# Patient Record
Sex: Female | Born: 1960 | Race: White | Hispanic: Yes | Smoking: Never smoker
Health system: Southern US, Community
[De-identification: ages and names within clinical notes are randomized; demographics above are authoritative.]

## PROBLEM LIST (undated history)

## (undated) DIAGNOSIS — IMO0002 Reserved for concepts with insufficient information to code with codable children: Secondary | ICD-10-CM

## (undated) DIAGNOSIS — I7300 Raynaud's syndrome without gangrene: Secondary | ICD-10-CM

## (undated) DIAGNOSIS — M3500 Sicca syndrome, unspecified: Secondary | ICD-10-CM

## (undated) DIAGNOSIS — M329 Systemic lupus erythematosus, unspecified: Secondary | ICD-10-CM

## (undated) HISTORY — PX: TUBAL LIGATION: SHX77

## (undated) HISTORY — PX: BREAST SURGERY: SHX581

---

## 1994-07-10 HISTORY — PX: BREAST CYST ASPIRATION: SHX578

## 1997-07-10 HISTORY — PX: BREAST EXCISIONAL BIOPSY: SUR124

## 2000-09-17 ENCOUNTER — Emergency Department (HOSPITAL_COMMUNITY): Admission: EM | Admit: 2000-09-17 | Discharge: 2000-09-17 | Payer: Self-pay | Admitting: Emergency Medicine

## 2001-04-09 ENCOUNTER — Other Ambulatory Visit: Admission: RE | Admit: 2001-04-09 | Discharge: 2001-04-09 | Payer: Self-pay | Admitting: Gynecology

## 2001-04-26 ENCOUNTER — Encounter: Payer: Self-pay | Admitting: Gynecology

## 2001-04-26 ENCOUNTER — Ambulatory Visit (HOSPITAL_COMMUNITY): Admission: RE | Admit: 2001-04-26 | Discharge: 2001-04-26 | Payer: Self-pay | Admitting: Gynecology

## 2001-12-12 ENCOUNTER — Ambulatory Visit (HOSPITAL_COMMUNITY): Admission: RE | Admit: 2001-12-12 | Discharge: 2001-12-12 | Payer: Self-pay | Admitting: Internal Medicine

## 2001-12-12 ENCOUNTER — Encounter: Payer: Self-pay | Admitting: Internal Medicine

## 2001-12-16 ENCOUNTER — Encounter (INDEPENDENT_AMBULATORY_CARE_PROVIDER_SITE_OTHER): Payer: Self-pay | Admitting: Family Medicine

## 2001-12-16 LAB — CONVERTED CEMR LAB: Pap Smear: NORMAL

## 2002-02-19 ENCOUNTER — Encounter: Admission: RE | Admit: 2002-02-19 | Discharge: 2002-02-19 | Payer: Self-pay | Admitting: Family Medicine

## 2002-02-19 ENCOUNTER — Encounter: Payer: Self-pay | Admitting: Family Medicine

## 2003-05-22 ENCOUNTER — Encounter: Admission: RE | Admit: 2003-05-22 | Discharge: 2003-05-22 | Payer: Self-pay | Admitting: Family Medicine

## 2003-06-05 ENCOUNTER — Encounter: Admission: RE | Admit: 2003-06-05 | Discharge: 2003-06-05 | Payer: Self-pay | Admitting: Family Medicine

## 2003-08-06 ENCOUNTER — Encounter: Admission: RE | Admit: 2003-08-06 | Discharge: 2003-08-06 | Payer: Self-pay | Admitting: Family Medicine

## 2004-01-08 ENCOUNTER — Emergency Department (HOSPITAL_COMMUNITY): Admission: EM | Admit: 2004-01-08 | Discharge: 2004-01-08 | Payer: Self-pay | Admitting: Emergency Medicine

## 2004-03-04 ENCOUNTER — Inpatient Hospital Stay (HOSPITAL_COMMUNITY): Admission: AD | Admit: 2004-03-04 | Discharge: 2004-03-04 | Payer: Self-pay | Admitting: Gynecology

## 2004-05-27 ENCOUNTER — Ambulatory Visit (HOSPITAL_COMMUNITY): Admission: RE | Admit: 2004-05-27 | Discharge: 2004-05-27 | Payer: Self-pay | Admitting: Family Medicine

## 2004-07-19 ENCOUNTER — Ambulatory Visit (HOSPITAL_COMMUNITY): Admission: RE | Admit: 2004-07-19 | Discharge: 2004-07-19 | Payer: Self-pay | Admitting: Internal Medicine

## 2005-12-13 ENCOUNTER — Ambulatory Visit (HOSPITAL_COMMUNITY): Admission: RE | Admit: 2005-12-13 | Discharge: 2005-12-13 | Payer: Self-pay | Admitting: Internal Medicine

## 2005-12-15 ENCOUNTER — Ambulatory Visit: Payer: Self-pay | Admitting: Family Medicine

## 2005-12-15 ENCOUNTER — Ambulatory Visit: Payer: Self-pay | Admitting: *Deleted

## 2006-03-22 ENCOUNTER — Encounter: Admission: RE | Admit: 2006-03-22 | Discharge: 2006-03-22 | Payer: Self-pay | Admitting: *Deleted

## 2006-04-09 ENCOUNTER — Ambulatory Visit (HOSPITAL_COMMUNITY): Admission: RE | Admit: 2006-04-09 | Discharge: 2006-04-09 | Payer: Self-pay | Admitting: Internal Medicine

## 2006-04-12 ENCOUNTER — Other Ambulatory Visit: Admission: RE | Admit: 2006-04-12 | Discharge: 2006-04-12 | Payer: Self-pay | Admitting: Gynecology

## 2006-04-19 ENCOUNTER — Encounter: Admission: RE | Admit: 2006-04-19 | Discharge: 2006-04-19 | Payer: Self-pay | Admitting: Gynecology

## 2006-10-30 ENCOUNTER — Ambulatory Visit (HOSPITAL_COMMUNITY): Admission: RE | Admit: 2006-10-30 | Discharge: 2006-10-31 | Payer: Self-pay | Admitting: Urology

## 2006-12-14 ENCOUNTER — Emergency Department (HOSPITAL_COMMUNITY): Admission: EM | Admit: 2006-12-14 | Discharge: 2006-12-14 | Payer: Self-pay | Admitting: Emergency Medicine

## 2007-05-09 ENCOUNTER — Encounter (INDEPENDENT_AMBULATORY_CARE_PROVIDER_SITE_OTHER): Payer: Self-pay | Admitting: Family Medicine

## 2007-05-09 DIAGNOSIS — M3500 Sicca syndrome, unspecified: Secondary | ICD-10-CM | POA: Insufficient documentation

## 2007-05-09 DIAGNOSIS — M25519 Pain in unspecified shoulder: Secondary | ICD-10-CM | POA: Insufficient documentation

## 2007-05-09 DIAGNOSIS — M542 Cervicalgia: Secondary | ICD-10-CM | POA: Insufficient documentation

## 2008-08-03 ENCOUNTER — Emergency Department (HOSPITAL_COMMUNITY): Admission: EM | Admit: 2008-08-03 | Discharge: 2008-08-03 | Payer: Self-pay | Admitting: Emergency Medicine

## 2008-12-16 ENCOUNTER — Emergency Department (HOSPITAL_COMMUNITY): Admission: EM | Admit: 2008-12-16 | Discharge: 2008-12-17 | Payer: Self-pay | Admitting: Emergency Medicine

## 2010-02-09 ENCOUNTER — Encounter: Admission: RE | Admit: 2010-02-09 | Discharge: 2010-02-09 | Payer: Self-pay | Admitting: Obstetrics and Gynecology

## 2010-03-05 ENCOUNTER — Emergency Department (HOSPITAL_COMMUNITY): Admission: EM | Admit: 2010-03-05 | Discharge: 2010-03-05 | Payer: Self-pay | Admitting: Emergency Medicine

## 2010-03-08 ENCOUNTER — Encounter: Admission: RE | Admit: 2010-03-08 | Discharge: 2010-03-08 | Payer: Self-pay | Admitting: Gastroenterology

## 2010-03-25 ENCOUNTER — Encounter: Admission: RE | Admit: 2010-03-25 | Discharge: 2010-03-25 | Payer: Self-pay | Admitting: Gastroenterology

## 2010-04-15 ENCOUNTER — Ambulatory Visit (HOSPITAL_COMMUNITY): Admission: RE | Admit: 2010-04-15 | Discharge: 2010-04-15 | Payer: Self-pay | Admitting: Gastroenterology

## 2010-05-12 ENCOUNTER — Emergency Department (HOSPITAL_COMMUNITY): Admission: EM | Admit: 2010-05-12 | Discharge: 2010-05-13 | Payer: Self-pay | Admitting: Emergency Medicine

## 2010-05-12 ENCOUNTER — Ambulatory Visit: Payer: Self-pay | Admitting: Cardiology

## 2010-07-24 ENCOUNTER — Emergency Department (HOSPITAL_COMMUNITY)
Admission: EM | Admit: 2010-07-24 | Discharge: 2010-07-24 | Payer: Self-pay | Source: Home / Self Care | Admitting: Emergency Medicine

## 2010-07-30 ENCOUNTER — Encounter: Payer: Self-pay | Admitting: Internal Medicine

## 2010-09-17 ENCOUNTER — Emergency Department (HOSPITAL_COMMUNITY)
Admission: EM | Admit: 2010-09-17 | Discharge: 2010-09-18 | Disposition: A | Discharge status: Home / Self Care | Payer: Medicaid Other | Attending: Emergency Medicine | Admitting: Emergency Medicine

## 2010-09-17 DIAGNOSIS — K219 Gastro-esophageal reflux disease without esophagitis: Secondary | ICD-10-CM | POA: Insufficient documentation

## 2010-09-17 DIAGNOSIS — M3500 Sicca syndrome, unspecified: Secondary | ICD-10-CM | POA: Insufficient documentation

## 2010-09-17 DIAGNOSIS — R059 Cough, unspecified: Secondary | ICD-10-CM | POA: Insufficient documentation

## 2010-09-17 DIAGNOSIS — I7300 Raynaud's syndrome without gangrene: Secondary | ICD-10-CM | POA: Insufficient documentation

## 2010-09-17 DIAGNOSIS — J069 Acute upper respiratory infection, unspecified: Secondary | ICD-10-CM | POA: Insufficient documentation

## 2010-09-17 DIAGNOSIS — R070 Pain in throat: Secondary | ICD-10-CM | POA: Insufficient documentation

## 2010-09-17 DIAGNOSIS — R05 Cough: Secondary | ICD-10-CM | POA: Insufficient documentation

## 2010-09-17 DIAGNOSIS — J3489 Other specified disorders of nose and nasal sinuses: Secondary | ICD-10-CM | POA: Insufficient documentation

## 2010-09-17 DIAGNOSIS — H9209 Otalgia, unspecified ear: Secondary | ICD-10-CM | POA: Insufficient documentation

## 2010-09-17 DIAGNOSIS — J40 Bronchitis, not specified as acute or chronic: Secondary | ICD-10-CM | POA: Insufficient documentation

## 2010-09-20 LAB — POCT CARDIAC MARKERS
CKMB, poc: <1 ng/mL — ABNORMAL LOW (ref 1.0–8.0)
Myoglobin, poc: 29 ng/mL (ref 12–200)
Myoglobin, poc: 34.5 ng/mL (ref 12–200)
Troponin i, poc: <0.05 ng/mL (ref 0.00–0.09)

## 2010-09-20 LAB — POCT I-STAT, CHEM 8
BUN: 10 mg/dL (ref 6–23)
HCT: 42 % (ref 36.0–46.0)
Sodium: 143 mEq/L (ref 135–145)
TCO2: 29 mmol/L (ref 0–100)

## 2010-10-24 LAB — DIFFERENTIAL
Basophils Absolute: 0 10*3/uL (ref 0.0–0.1)
Basophils Relative: 1 % (ref 0–1)
Eosinophils Absolute: 0.1 10*3/uL (ref 0.0–0.7)
Eosinophils Relative: 2 % (ref 0–5)
Lymphocytes Relative: 25 % (ref 12–46)
Monocytes Absolute: 0.3 10*3/uL (ref 0.1–1.0)

## 2010-10-24 LAB — CBC
HCT: 38.3 % (ref 36.0–46.0)
Hemoglobin: 12.8 g/dL (ref 12.0–15.0)
MCHC: 33.3 g/dL (ref 30.0–36.0)
MCV: 81.5 fL (ref 78.0–100.0)
RDW: 12.4 % (ref 11.5–15.5)

## 2010-10-24 LAB — BASIC METABOLIC PANEL
Calcium: 9.3 mg/dL (ref 8.4–10.5)
Creatinine, Ser: 0.43 mg/dL (ref 0.4–1.2)
GFR calc Af Amer: >60 mL/min (ref >60)
GFR calc non Af Amer: >60 mL/min (ref >60)

## 2010-10-24 LAB — POCT CARDIAC MARKERS
CKMB, poc: <1 ng/mL — ABNORMAL LOW (ref 1.0–8.0)
CKMB, poc: <1 ng/mL — ABNORMAL LOW (ref 1.0–8.0)
Troponin i, poc: <0.05 ng/mL (ref 0.00–0.09)
Troponin i, poc: <0.05 ng/mL (ref 0.00–0.09)

## 2010-11-13 ENCOUNTER — Emergency Department (HOSPITAL_COMMUNITY)
Admission: EM | Admit: 2010-11-13 | Discharge: 2010-11-13 | Disposition: A | Discharge status: Home / Self Care | Payer: Medicaid Other | Attending: Emergency Medicine | Admitting: Emergency Medicine

## 2010-11-13 DIAGNOSIS — L259 Unspecified contact dermatitis, unspecified cause: Secondary | ICD-10-CM | POA: Insufficient documentation

## 2010-11-13 DIAGNOSIS — I7300 Raynaud's syndrome without gangrene: Secondary | ICD-10-CM | POA: Insufficient documentation

## 2010-11-13 DIAGNOSIS — M3500 Sicca syndrome, unspecified: Secondary | ICD-10-CM | POA: Insufficient documentation

## 2010-11-13 DIAGNOSIS — K219 Gastro-esophageal reflux disease without esophagitis: Secondary | ICD-10-CM | POA: Insufficient documentation

## 2010-11-25 NOTE — Op Note (Signed)
NAMECARLYON, Cheryl White                 ACCOUNT NO.:  [id number]   MEDICAL RECORD NO.:  [id number]          PATIENT TYPE:  AMB   LOCATION:  DAY                          FACILITY:  [facility]   PHYSICIAN:  Martina Sinner, MD DATE OF BIRTH:  Apr 06, 1961   DATE OF PROCEDURE:  10/30/2006  DATE OF DISCHARGE:                               OPERATIVE REPORT   SURGEON:  Martina Sinner, MD   ASSISTANT:  Terie Purser, MD   PREOPERATIVE DIAGNOSES:  Stress incontinence, cystocele.   POSTOPERATIVE DIAGNOSES:  Stress incontinence and mild cystocele.   SURGERY:  Sling cystourethropexy [facility]) plus cystoscopy.   Ms. Mordecai Maes has a small asymptomatic cystocele and stress urinary  incontinence.  She failed medical therapy for the urge component.  She  consented to a sling and cystocele repair.   Under general anesthesia, she was prepped and draped in the usual  fashion.  She was given preoperative antibiotics.  Bladder was normal.  Extra care was taken to minimize the risk of compartment syndrome,  neuropathy, and DVT.   I examined the patient with a long and with and without a short vaginal  speculum, and she had little to no cystocele.  There was no central  defect.  I felt that an anterior repair was not in the patient's best  interest.   Two 1 cm incisions were made 1.5 cm lateral to the midline and 1  fingerbreadth above the symphysis pubis.  Two cm incision was made  overlying the mid urethra.  Epinephrine and lidocaine mixture was  utilized along the raised, thick vaginal wall edge for good closure.  I  bluntly dissected the urethrovesical angle bilaterally.   With the bladder emptied, I passed a SPARC needle on top of and along  the back of the symphysis pubis parallel to the midline under the pulp  of my left index finger.  I cystoscoped the patient.  On the right side,  the trocar appeared to be a little bit close to the bladder on  deflection, so I replaced it.  I was happy with this  position.   There was no injury to the bladder or urethra.  There was efflux of  indigo carmine from both ureteral orifices.   With the bladder emptied, the SPARC sling was attached and brought up  through the retropubic space.  I cut below the blue dots, irrigated the  sheath and removed the sheaths, tensing the sling over the fat part of a  Kelly clamp.  I was very happy with the position and tension of the  sling.  There was hypermobility in the midline as well as lateral to the  midline.   Copious irrigation was utilized.  The case was dry until we pulled the  sheaths off the sling.  In my opinion, there was a little bit of brisk  bleeding and necessitated a pack overnight.  I thought this was in the  patient's best interest.  I closed the anterior vaginal wall incision  with running 2-0 Vicryl.  I did 2 interrupted sutures.  I closed the  subcuticular with 4-0 Vicryl and Dermabond.   A double vaginal pack was gently inserted.  Foley catheter was draining  blue urine at the end of the case.  Hopefully this operation will reach  the treatment goal. We will keep a close eye on her hemoglobin.  I  suspect that the bleeding will settle down quickly postoperatively.           ______________________________  Martina Sinner, MD  Electronically Signed     SAM/MEDQ  D:  10/30/2006  T:  10/30/2006  Job:  [id number]

## 2010-12-04 ENCOUNTER — Emergency Department (HOSPITAL_BASED_OUTPATIENT_CLINIC_OR_DEPARTMENT_OTHER)
Admission: EM | Admit: 2010-12-04 | Discharge: 2010-12-04 | Disposition: A | Discharge status: Home / Self Care | Payer: Medicaid Other | Attending: Emergency Medicine | Admitting: Emergency Medicine

## 2010-12-04 DIAGNOSIS — K219 Gastro-esophageal reflux disease without esophagitis: Secondary | ICD-10-CM | POA: Insufficient documentation

## 2010-12-04 DIAGNOSIS — I776 Arteritis, unspecified: Secondary | ICD-10-CM | POA: Insufficient documentation

## 2010-12-04 DIAGNOSIS — R21 Rash and other nonspecific skin eruption: Secondary | ICD-10-CM | POA: Insufficient documentation

## 2011-04-11 ENCOUNTER — Emergency Department (HOSPITAL_COMMUNITY)
Admission: EM | Admit: 2011-04-11 | Discharge: 2011-04-12 | Disposition: A | Discharge status: Home / Self Care | Payer: Medicaid Other | Attending: Emergency Medicine | Admitting: Emergency Medicine

## 2011-04-11 DIAGNOSIS — L2989 Other pruritus: Secondary | ICD-10-CM | POA: Insufficient documentation

## 2011-04-11 DIAGNOSIS — M79609 Pain in unspecified limb: Secondary | ICD-10-CM | POA: Insufficient documentation

## 2011-04-11 DIAGNOSIS — T6391XA Toxic effect of contact with unspecified venomous animal, accidental (unintentional), initial encounter: Secondary | ICD-10-CM | POA: Insufficient documentation

## 2011-04-11 DIAGNOSIS — M3500 Sicca syndrome, unspecified: Secondary | ICD-10-CM | POA: Insufficient documentation

## 2011-04-11 DIAGNOSIS — L298 Other pruritus: Secondary | ICD-10-CM | POA: Insufficient documentation

## 2011-04-11 DIAGNOSIS — I7300 Raynaud's syndrome without gangrene: Secondary | ICD-10-CM | POA: Insufficient documentation

## 2011-04-11 DIAGNOSIS — T63391A Toxic effect of venom of other spider, accidental (unintentional), initial encounter: Secondary | ICD-10-CM | POA: Insufficient documentation

## 2011-04-11 DIAGNOSIS — K219 Gastro-esophageal reflux disease without esophagitis: Secondary | ICD-10-CM | POA: Insufficient documentation

## 2011-05-16 ENCOUNTER — Emergency Department (HOSPITAL_COMMUNITY)
Admission: EM | Admit: 2011-05-16 | Discharge: 2011-05-16 | Discharge status: Against Medical Advice | Payer: Medicaid Other | Attending: Emergency Medicine | Admitting: Emergency Medicine

## 2011-05-16 DIAGNOSIS — R52 Pain, unspecified: Secondary | ICD-10-CM | POA: Insufficient documentation

## 2011-05-16 HISTORY — DX: Sjogren syndrome, unspecified: M35.00

## 2011-05-16 HISTORY — DX: Raynaud's syndrome without gangrene: I73.00

## 2011-05-16 NOTE — ED Notes (Signed)
Pt here for general illness, body aches, abd pain, difficutly breathing, and not feeling well. Hx of reynauds and sjogrens dz, and sts that at times during the winter this type of ill ness occurs.

## 2011-06-02 ENCOUNTER — Emergency Department (HOSPITAL_BASED_OUTPATIENT_CLINIC_OR_DEPARTMENT_OTHER)
Admission: EM | Admit: 2011-06-02 | Discharge: 2011-06-03 | Disposition: A | Discharge status: Home / Self Care | Payer: Medicaid Other | Attending: Emergency Medicine | Admitting: Emergency Medicine

## 2011-06-02 ENCOUNTER — Emergency Department (INDEPENDENT_AMBULATORY_CARE_PROVIDER_SITE_OTHER): Payer: Medicaid Other

## 2011-06-02 ENCOUNTER — Encounter (HOSPITAL_BASED_OUTPATIENT_CLINIC_OR_DEPARTMENT_OTHER): Payer: Self-pay

## 2011-06-02 DIAGNOSIS — R51 Headache: Secondary | ICD-10-CM | POA: Insufficient documentation

## 2011-06-02 DIAGNOSIS — Z79899 Other long term (current) drug therapy: Secondary | ICD-10-CM | POA: Insufficient documentation

## 2011-06-02 DIAGNOSIS — R110 Nausea: Secondary | ICD-10-CM | POA: Insufficient documentation

## 2011-06-02 NOTE — ED Provider Notes (Signed)
History     CSN: [id number] Arrival date & time: 06/02/2011 10:51 PM   First MD Initiated Contact with Patient 06/02/11 2311      Chief Complaint  Patient presents with  . Headache    (Consider location/radiation/quality/duration/timing/severity/associated sxs/prior treatment) HPI Comments: Chronic headaches, has frequent headaches over the last year but has had significant increase in the last 3 weeks. This is associated with nausea the patient denies change in vision, focal numbness weakness or ataxia and denies dizziness or change in hearing. She has tried ibuprofen and Tylenol without relief. She has recently seen her physician for anxiety and was prescribed an anti-anxiety medicine which she has not taken due 2 concern for side effects. Otherwise she denies fever, stiff neck, focal neurologic deficits  Patient is a 50 y.o. female presenting with headaches. The history is provided by the patient and a relative. The history is limited by a language barrier.  Headache  This is a chronic problem. Episode onset: 3 weeks ago. The problem occurs constantly. The problem has been gradually worsening. Associated with: Has a history of chronic headaches but has had significant change in the last 3 weeks. The pain is located in the left unilateral region. The quality of the pain is described as throbbing. The pain is moderate. The pain does not radiate. Associated symptoms include nausea. She has tried acetaminophen and NSAIDs for the symptoms. The treatment provided no relief.    Past Medical History  Diagnosis Date  . Sjogren's disease   . Raynaud's disease     Past Surgical History  Procedure Date  . Breast surgery   . Cesarean section     History reviewed. No pertinent family history.  History  Substance Use Topics  . Smoking status: Never Smoker   . Smokeless tobacco: Not on file  . Alcohol Use: No    OB History    Grav Para Term Preterm Abortions TAB SAB Ect Mult Living                 Review of Systems  Gastrointestinal: Positive for nausea.  Neurological: Positive for headaches.  All other systems reviewed and are negative.    Allergies  Review of patient's allergies indicates no known allergies.  Home Medications   Current Outpatient Rx  Name Route Sig Dispense Refill  . FLUTICASONE PROPIONATE 50 MCG/ACT NA SUSP Nasal Place 1 spray into the nose daily.      Marland Kitchen DILTIAZEM HCL COATED BEADS 120 MG PO CP24 Oral Take 120 mg by mouth daily.      Marland Kitchen LORATADINE 10 MG PO TABS Oral Take 10 mg by mouth daily.      Marland Kitchen NAPROXEN 500 MG PO TABS Oral Take 1 tablet (500 mg total) by mouth 2 (two) times daily with a meal. 30 tablet 0  . TERBINAFINE HCL 250 MG PO TABS Oral Take 250 mg by mouth daily.        BP 91/45  Pulse 58  Temp 98.1 F (36.7 C)  Resp 15  Ht 5\' 1"  (1.549 m)  Wt 119 lb (53.978 kg)  BMI 22.48 kg/m2  SpO2 98%  Physical Exam  Nursing note and vitals reviewed. Constitutional: She appears well-developed and well-nourished. No distress.  HENT:  Head: Normocephalic and atraumatic.  Mouth/Throat: Oropharynx is clear and moist. No oropharyngeal exudate.       Dentition in good repair, no significant tenderness or abscess is present, no temporomandibular joint pain, no malocclusion  Eyes: Conjunctivae and  EOM are normal. Pupils are equal, round, and reactive to light. Right eye exhibits no discharge. Left eye exhibits no discharge. No scleral icterus.  Neck: Normal range of motion. Neck supple. No JVD present. No thyromegaly present.  Cardiovascular: Normal rate, regular rhythm, normal heart sounds and intact distal pulses.  Exam reveals no gallop and no friction rub.   No murmur heard. Pulmonary/Chest: Effort normal and breath sounds normal. No respiratory distress. She has no wheezes. She has no rales.  Abdominal: Soft. Bowel sounds are normal. She exhibits no distension and no mass. There is no tenderness.  Musculoskeletal: Normal range of  motion. She exhibits no edema and no tenderness.  Lymphadenopathy:    She has no cervical adenopathy.  Neurological: She is alert. Coordination normal.       Speech clear, follows commands, no focal weakness or numbness, cranial nerves III through XII intact, gait normal  Skin: Skin is warm and dry. No rash noted. No erythema.  Psychiatric: She has a normal mood and affect. Her behavior is normal.    ED Course  Procedures (including critical care time)  Labs Reviewed - No data to display Ct Head Wo Contrast  06/03/2011  *RADIOLOGY REPORT*  Clinical Data: Headache and left eye trembling; history of Sjogren's disease and Raynaud's disease.  CT HEAD WITHOUT CONTRAST  Technique:  Contiguous axial images were obtained from the base of the skull through the vertex without contrast.  Comparison: CT of the head performed 12/14/2006  Findings: There is no evidence of acute infarction, mass lesion, or intra- or extra-axial hemorrhage on CT.  The posterior fossa, including the cerebellum, brainstem and fourth ventricle, is within normal limits.  The third and lateral ventricles, and basal ganglia are unremarkable in appearance.  The cerebral hemispheres are symmetric in appearance, with normal gray- white differentiation.  No mass effect or midline shift is seen.  There is no evidence of fracture; visualized osseous structures are unremarkable in appearance.  The visualized portions of the orbits are within normal limits.  The paranasal sinuses and mastoid air cells are well-aerated.  No significant soft tissue abnormalities are seen.  IMPRESSION: Unremarkable noncontrast CT of the head.  Original Report Authenticated By: Tonia Ghent, M.D.     1. Headache       MDM  Headaches worsening over the last 3 weeks, no focal neurologic deficits and normal vital signs.  There is no meningismus, fever or tachycardia.  CT scan negative, vital signs reassuring, no fever or meningismus , No focal neurologic  findings on exam and oxygen saturation, pulse normal. Intramuscular Toradol given prior to discharge     Vida Roller, MD 06/03/11 [phone]

## 2011-06-02 NOTE — ED Notes (Signed)
C/o HA and "trembling" to left-eye-pt speaks minimal english-daughter interpreting

## 2011-06-02 NOTE — ED Notes (Signed)
Was seen by PCP 5 days ago for same c/o-started on lexapro-pt has npt been taking

## 2011-06-03 MED ORDER — KETOROLAC TROMETHAMINE 60 MG/2ML IM SOLN
60.0000 mg | Freq: Once | INTRAMUSCULAR | Status: AC
  Administered 2011-06-03: 60 mg via INTRAMUSCULAR
  Filled 2011-06-03: qty 2

## 2011-06-03 MED ORDER — NAPROXEN 500 MG PO TABS: 500.0000 mg | ORAL_TABLET | Freq: Two times a day (BID) | ORAL | Status: AC

## 2011-06-03 MED ORDER — NAPROXEN 500 MG PO TABS: 500.0000 mg | ORAL_TABLET | Freq: Two times a day (BID) | ORAL | Status: DC

## 2011-07-29 ENCOUNTER — Emergency Department (INDEPENDENT_AMBULATORY_CARE_PROVIDER_SITE_OTHER): Payer: Medicaid Other

## 2011-07-29 ENCOUNTER — Emergency Department (HOSPITAL_BASED_OUTPATIENT_CLINIC_OR_DEPARTMENT_OTHER)
Admission: EM | Admit: 2011-07-29 | Discharge: 2011-07-29 | Disposition: A | Discharge status: Home / Self Care | Payer: Medicaid Other | Attending: Emergency Medicine | Admitting: Emergency Medicine

## 2011-07-29 ENCOUNTER — Encounter (HOSPITAL_BASED_OUTPATIENT_CLINIC_OR_DEPARTMENT_OTHER): Payer: Self-pay | Admitting: *Deleted

## 2011-07-29 DIAGNOSIS — E860 Dehydration: Secondary | ICD-10-CM | POA: Insufficient documentation

## 2011-07-29 DIAGNOSIS — R0989 Other specified symptoms and signs involving the circulatory and respiratory systems: Secondary | ICD-10-CM

## 2011-07-29 DIAGNOSIS — J069 Acute upper respiratory infection, unspecified: Secondary | ICD-10-CM | POA: Insufficient documentation

## 2011-07-29 DIAGNOSIS — B349 Viral infection, unspecified: Secondary | ICD-10-CM

## 2011-07-29 DIAGNOSIS — B9789 Other viral agents as the cause of diseases classified elsewhere: Secondary | ICD-10-CM | POA: Insufficient documentation

## 2011-07-29 DIAGNOSIS — R5381 Other malaise: Secondary | ICD-10-CM

## 2011-07-29 DIAGNOSIS — R05 Cough: Secondary | ICD-10-CM

## 2011-07-29 LAB — COMPREHENSIVE METABOLIC PANEL
Albumin: 4.2 g/dL (ref 3.5–5.2)
Alkaline Phosphatase: 127 U/L — ABNORMAL HIGH (ref 39–117)
BUN: 12 mg/dL (ref 6–23)
Calcium: 9.6 mg/dL (ref 8.4–10.5)
Creatinine, Ser: 0.7 mg/dL (ref 0.50–1.10)
GFR calc Af Amer: >90 mL/min (ref >90)
Glucose, Bld: 111 mg/dL — ABNORMAL HIGH (ref 70–99)
Potassium: 3.9 mEq/L (ref 3.5–5.1)
Total Protein: 7.7 g/dL (ref 6.0–8.3)

## 2011-07-29 LAB — URINALYSIS, ROUTINE W REFLEX MICROSCOPIC
Bilirubin Urine: NEGATIVE
Nitrite: NEGATIVE
Specific Gravity, Urine: 1.008 (ref 1.005–1.030)
Urobilinogen, UA: 0.2 mg/dL (ref 0.0–1.0)

## 2011-07-29 LAB — CBC
Hemoglobin: 12.7 g/dL (ref 12.0–15.0)
MCH: 26.5 pg (ref 26.0–34.0)
MCHC: 33.2 g/dL (ref 30.0–36.0)
MCV: 79.8 fL (ref 78.0–100.0)
RBC: 4.8 MIL/uL (ref 3.87–5.11)

## 2011-07-29 LAB — DIFFERENTIAL
Basophils Relative: 0 % (ref 0–1)
Eosinophils Absolute: 0.1 10*3/uL (ref 0.0–0.7)
Eosinophils Relative: 3 % (ref 0–5)
Lymphs Abs: 1 10*3/uL (ref 0.7–4.0)
Monocytes Absolute: 0.5 10*3/uL (ref 0.1–1.0)
Monocytes Relative: 10 % (ref 3–12)

## 2011-07-29 LAB — URINE MICROSCOPIC-ADD ON

## 2011-07-29 LAB — LACTIC ACID, PLASMA: Lactic Acid, Venous: 0.9 mmol/L (ref 0.5–2.2)

## 2011-07-29 MED ORDER — SODIUM CHLORIDE 0.9 % IV SOLN: INTRAVENOUS | Status: DC

## 2011-07-29 MED ORDER — SODIUM CHLORIDE 0.9 % IV BOLUS (SEPSIS)
2000.0000 mL | Freq: Once | INTRAVENOUS | Status: AC
  Administered 2011-07-29: 2000 mL via INTRAVENOUS

## 2011-07-29 NOTE — ED Notes (Signed)
Pt states she has had cough, congestion, fever and body aches since Wed.

## 2011-07-29 NOTE — ED Provider Notes (Signed)
History    Scribed for Hurman Horn, MD, the patient was seen in room MH05/MH05. This chart was scribed by Katha Cabal.   CSN: [id number]  Arrival date & time 07/29/11  1702   First MD Initiated Contact with Patient 07/29/11 1932      Chief Complaint  Patient presents with  . Cough    (Consider location/radiation/quality/duration/timing/severity/associated sxs/prior treatment) HPI Cheryl White is a 51 y.o. female who presents to the Emergency Department complaining of persistent moderate cough congestion, myalgias, fatigue, and rhinorrhea that began 3-4 days ago.  There is history of asthma.  Patient has not been eating or drinking normally.  Symptoms are associated with chills, mild headache and shortness of breath.  Symptoms are not associated with fever, neck stiffness, rash burning with urination, urinary frequency or abdominal pain.  Patient with history of Raynaud disease and headaches.    Past Medical History  Diagnosis Date  . Sjogren's disease   . Raynaud's disease     Past Surgical History  Procedure Date  . Breast surgery   . Cesarean section     History reviewed. No pertinent family history.  History  Substance Use Topics  . Smoking status: Never Smoker   . Smokeless tobacco: Not on file  . Alcohol Use: No    OB History    Grav Para Term Preterm Abortions TAB SAB Ect Mult Living                  Review of Systems  Constitutional: Positive for chills and fatigue. Negative for fever.       10 Systems reviewed and are negative for acute change except as noted in the HPI.  HENT: Positive for congestion and rhinorrhea. Negative for neck stiffness.   Eyes: Negative for discharge and redness.  Respiratory: Positive for cough and shortness of breath.   Cardiovascular: Negative for chest pain.  Gastrointestinal: Negative for vomiting and abdominal pain.  Genitourinary: Negative for dysuria and frequency.  Musculoskeletal: Positive for myalgias.  Negative for back pain.  Skin: Negative for rash.  Neurological: Negative for syncope, numbness and headaches.  Psychiatric/Behavioral:       No behavior change.    Allergies  Review of patient's allergies indicates no known allergies.  Home Medications   Current Outpatient Rx  Name Route Sig Dispense Refill  . NAPROXEN 500 MG PO TABS Oral Take 1 tablet (500 mg total) by mouth 2 (two) times daily with a meal. 30 tablet 0    BP 103/68  Pulse 71  Temp(Src) 98.6 F (37 C) (Oral)  Resp 20  Ht 5\' 5"  (1.651 m)  Wt 120 lb (54.432 kg)  BMI 19.97 kg/m2  SpO2 100%  Physical Exam  Nursing note and vitals reviewed. Constitutional:       Awake, alert, nontoxic appearance.  HENT:  Head: Atraumatic.  Mouth/Throat: No oropharyngeal exudate or posterior oropharyngeal edema.  Eyes: Right eye exhibits no discharge. Left eye exhibits no discharge.  Neck: Neck supple.  Pulmonary/Chest: Effort normal and breath sounds normal. No respiratory distress. She has no wheezes. She has no rales. She exhibits no tenderness.  Abdominal: Soft. There is no tenderness. There is no rebound.  Musculoskeletal: She exhibits no tenderness.       Back non tender  Lymphadenopathy:    She has no cervical adenopathy.  Neurological:       Mental status and motor strength appears baseline for patient and situation.  Skin: No rash noted.  Psychiatric: She has a normal mood and affect.    ED Course  Procedures (including critical care time)   DIAGNOSTIC STUDIES: Oxygen Saturation is 100% on room air, normal by my interpretation.     COORDINATION OF CARE:  7:55 PM  Physical exam complete.  IV fluids, UA and labs and CXR.  10:52  PM  Discussed radiological and laboratory findings with patient.  Unlikely bacterial.  Patient feels much better.  Plan to discharge patient home.  Patient agrees with plan.        LABS / RADIOLOGY:   Labs Reviewed  COMPREHENSIVE METABOLIC PANEL - Abnormal; Notable for the  following:    Glucose, Bld 111 (*)    Alkaline Phosphatase 127 (*)    Total Bilirubin 0.2 (*)    All other components within normal limits  URINALYSIS, ROUTINE W REFLEX MICROSCOPIC - Abnormal; Notable for the following:    Leukocytes, UA SMALL (*)    All other components within normal limits  CBC  DIFFERENTIAL  LACTIC ACID, PLASMA  PREGNANCY, URINE  URINE MICROSCOPIC-ADD ON  CULTURE, BLOOD (ROUTINE X 2)  CULTURE, BLOOD (ROUTINE X 2)   Dg Chest 2 View  07/29/2011  *RADIOLOGY REPORT*  Clinical Data: Cough, congestion, fatigue.  CHEST - 2 VIEW  Comparison: 05/12/2010  Findings: Heart and mediastinal contours are within normal limits. No focal opacities or effusions.  No acute bony abnormality.  IMPRESSION: No active cardiopulmonary disease.  Original Report Authenticated By: Cyndie Chime, M.D.     1. URI (upper respiratory infection)   2. Viral syndrome   3. Dehydration       MDM  I doubt any other EMC precluding discharge at this time including, but not necessarily limited to the following:SBI, sepsis.     I personally performed the services described in this documentation, which was scribed in my presence. The recorded information has been reviewed and considered.    Hurman Horn, MD 07/30/11 [phone]

## 2011-07-29 NOTE — ED Notes (Signed)
Bednar MD at bedside. 

## 2011-07-29 NOTE — ED Notes (Signed)
D/c home with family- no rx given- states "i feel much better"

## 2011-08-05 LAB — CULTURE, BLOOD (ROUTINE X 2)
Culture  Setup Time: 201301200158
Culture: NO GROWTH

## 2012-12-29 ENCOUNTER — Encounter (HOSPITAL_BASED_OUTPATIENT_CLINIC_OR_DEPARTMENT_OTHER): Payer: Self-pay | Admitting: *Deleted

## 2012-12-29 DIAGNOSIS — Z8679 Personal history of other diseases of the circulatory system: Secondary | ICD-10-CM | POA: Insufficient documentation

## 2012-12-29 DIAGNOSIS — Y9389 Activity, other specified: Secondary | ICD-10-CM | POA: Insufficient documentation

## 2012-12-29 DIAGNOSIS — Y929 Unspecified place or not applicable: Secondary | ICD-10-CM | POA: Insufficient documentation

## 2012-12-29 DIAGNOSIS — S7010XA Contusion of unspecified thigh, initial encounter: Secondary | ICD-10-CM | POA: Insufficient documentation

## 2012-12-29 DIAGNOSIS — Z8739 Personal history of other diseases of the musculoskeletal system and connective tissue: Secondary | ICD-10-CM | POA: Insufficient documentation

## 2012-12-29 DIAGNOSIS — X58XXXA Exposure to other specified factors, initial encounter: Secondary | ICD-10-CM | POA: Insufficient documentation

## 2012-12-29 NOTE — ED Notes (Signed)
Pt speaks little [place]. C/o bruising to her right upper leg area. States she awoke with this bruise. Small soft bruise noted to right ant upper leg on exam. No other complaints.

## 2012-12-30 ENCOUNTER — Emergency Department (HOSPITAL_BASED_OUTPATIENT_CLINIC_OR_DEPARTMENT_OTHER)
Admission: EM | Admit: 2012-12-30 | Discharge: 2012-12-30 | Disposition: A | Discharge status: Home / Self Care | Payer: BC Managed Care – PPO | Attending: Emergency Medicine | Admitting: Emergency Medicine

## 2012-12-30 DIAGNOSIS — T148XXA Other injury of unspecified body region, initial encounter: Secondary | ICD-10-CM

## 2012-12-30 LAB — CBC WITH DIFFERENTIAL/PLATELET
Eosinophils Absolute: 0.1 10*3/uL (ref 0.0–0.7)
Eosinophils Relative: 2 % (ref 0–5)
Hemoglobin: 12.8 g/dL (ref 12.0–15.0)
Lymphs Abs: 1.1 10*3/uL (ref 0.7–4.0)
MCH: 27.3 pg (ref 26.0–34.0)
MCV: 82.3 fL (ref 78.0–100.0)
Monocytes Relative: 12 % (ref 3–12)
RBC: 4.69 MIL/uL (ref 3.87–5.11)

## 2012-12-30 NOTE — ED Provider Notes (Signed)
History    Scribed for Hanley Seamen, MD, the patient was seen in room MH11/MH11. This chart was scribed by Lewanda Rife, ED scribe. Patient's care was started at 0050   CSN: [id number]  Arrival date & time 12/29/12  2320   First MD Initiated Contact with Patient 12/30/12 [phone]      Chief Complaint  Patient presents with  . bruising to leg     (Consider location/radiation/quality/duration/timing/severity/associated sxs/prior treatment) The history is provided by the patient. A language interpreter was used.   HPI Comments: Cheryl White is a 52 y.o. female who presents to the Emergency Department complaining of "bleeding" (as she gestures to bruise) on right upper leg onset 4 days. Reports associated scattered mild bruising on different parts of her body. Reports mild tenderness to sites of bruising are aggravated by touch and alleviated by nothing. Describes pain as soreness. Denies associated fever, recent injuries, and other related complaints. Reports Dr. Parke Simmers is PCP and is evaluating her for recurrent bruising. Denies taking any medications to treat illnesses at this. Reports hx of Sjogren's disease, and Raynaud's disease.     Past Medical History  Diagnosis Date  . Sjogren's disease   . Raynaud's disease     Past Surgical History  Procedure Laterality Date  . Breast surgery    . Cesarean section      No family history on file.  History  Substance Use Topics  . Smoking status: Never Smoker   . Smokeless tobacco: Not on file  . Alcohol Use: No    OB History   Grav Para Term Preterm Abortions TAB SAB Ect Mult Living                  Review of Systems  Constitutional: Negative for fever.  Musculoskeletal: Negative for myalgias.  Skin: Negative for wound.  Hematological: Bruises/bleeds easily.  Psychiatric/Behavioral: Negative for confusion.    Allergies  Review of patient's allergies indicates no known allergies.  Home Medications  No current  outpatient prescriptions on file.  BP 102/58  Pulse 63  Temp(Src) 98.9 F (37.2 C) (Oral)  Resp 16  Wt 112 lb (50.803 kg)  BMI 18.64 kg/m2  SpO2 100%  Physical Exam  Nursing note and vitals reviewed. Constitutional: She is oriented to person, place, and time. She appears well-developed and well-nourished. No distress.  HENT:  Head: Normocephalic and atraumatic.  Eyes: EOM are normal.  Neck: Neck supple. No tracheal deviation present.  Cardiovascular: Normal rate, regular rhythm, normal heart sounds and intact distal pulses.   Pulses:      Dorsalis pedis pulses are 2+ on the right side, and 2+ on the left side.  Pulmonary/Chest: Effort normal and breath sounds normal. No respiratory distress. She has no wheezes. She has no rales.  Abdominal: Soft.  Musculoskeletal: Normal range of motion. She exhibits no tenderness.  No calf tenderness on exam   Neurological: She is alert and oriented to person, place, and time.  Skin: Skin is warm and dry. Ecchymosis noted.  Ecchymosis noted on right upper thigh and scattered other places   Psychiatric: She has a normal mood and affect. Her behavior is normal.   Ecchymoses are mild and superficial.  ED Course  Procedures (including critical care time)    MDM   Nursing notes and vitals signs, including pulse oximetry, reviewed.  Summary of this visit's results, reviewed by myself:  Labs:  Results for orders placed during the hospital encounter of 12/30/12 (from  the past 24 hour(s))  CBC WITH DIFFERENTIAL     Status: Abnormal   Collection Time    12/30/12  1:14 AM      Result Value Range   WBC 3.4 (*) 4.0 - 10.5 K/uL   RBC 4.69  3.87 - 5.11 MIL/uL   Hemoglobin 12.8  12.0 - 15.0 g/dL   HCT 11.9  14.7 - 82.9 %   MCV 82.3  78.0 - 100.0 fL   MCH 27.3  26.0 - 34.0 pg   MCHC 33.2  30.0 - 36.0 g/dL   RDW 56.2  13.0 - 86.5 %   Platelets 154  150 - 400 K/uL   Neutrophils Relative % 52  43 - 77 %   Neutro Abs 1.8  1.7 - 7.7 K/uL    Lymphocytes Relative 33  12 - 46 %   Lymphs Abs 1.1  0.7 - 4.0 K/uL   Monocytes Relative 12  3 - 12 %   Monocytes Absolute 0.4  0.1 - 1.0 K/uL   Eosinophils Relative 2  0 - 5 %   Eosinophils Absolute 0.1  0.0 - 0.7 K/uL   Basophils Relative 0  0 - 1 %   Basophils Absolute 0.0  0.0 - 0.1 K/uL  PROTIME-INR     Status: None   Collection Time    12/30/12  1:14 AM      Result Value Range   Prothrombin Time 12.7  11.6 - 15.2 seconds   INR 0.96  0.00 - 1.49  APTT     Status: None   Collection Time    12/30/12  1:14 AM      Result Value Range   aPTT 35  24 - 37 seconds   No evidence of thrombocytopenia or other coagulopathy. She will follow up with her primary care physician.      I personally performed the services described in this documentation, which was scribed in my presence.  The recorded information has been reviewed and is accurate.  Hanley Seamen, MD 12/30/12 [phone]

## 2014-10-20 ENCOUNTER — Ambulatory Visit (HOSPITAL_COMMUNITY): Payer: Medicaid Other | Admitting: Physician Assistant

## 2014-10-30 ENCOUNTER — Ambulatory Visit (INDEPENDENT_AMBULATORY_CARE_PROVIDER_SITE_OTHER): Payer: Medicaid Other | Admitting: Physician Assistant

## 2014-10-30 DIAGNOSIS — I471 Supraventricular tachycardia: Secondary | ICD-10-CM

## 2014-10-30 DIAGNOSIS — G4700 Insomnia, unspecified: Secondary | ICD-10-CM | POA: Diagnosis not present

## 2014-10-30 NOTE — Progress Notes (Deleted)
Psychiatric Assessment Adult  Patient Identification:  Cheryl White Date of Evaluation:  10/30/2014 Chief Complaint: *** History of Chief Complaint:  No chief complaint on file.   HPI Review of Systems Physical Exam  Depressive Symptoms: {DEPRESSION SYMPTOMS:20000}  (Hypo) Manic Symptoms:   Elevated Mood:  {BHH YES OR NO:22294} Irritable Mood:  {BHH YES OR NO:22294} Grandiosity:  {BHH YES OR NO:22294} Distractibility:  {BHH YES OR NO:22294} Labiality of Mood:  {BHH YES OR NO:22294} Delusions:  {BHH YES OR NO:22294} Hallucinations:  {BHH YES OR NO:22294} Impulsivity:  {BHH YES OR NO:22294} Sexually Inappropriate Behavior:  {BHH YES OR NO:22294} Financial Extravagance:  {BHH YES OR NO:22294} Flight of Ideas:  {BHH YES OR NO:22294}  Anxiety Symptoms: Excessive Worry:  {BHH YES OR NO:22294} Panic Symptoms:  {BHH YES OR NO:22294} Agoraphobia:  {BHH YES OR NO:22294} Obsessive Compulsive: {BHH YES OR NO:22294}  Symptoms: {Obsessive Compulsive Symptoms:22671} Specific Phobias:  {BHH YES OR NO:22294} Social Anxiety:  {BHH YES OR NO:22294}  Psychotic Symptoms:  Hallucinations: {BHH YES OR NO:22294} {Hallucinations:22672} Delusions:  {BHH YES OR NO:22294} Paranoia:  {BHH YES OR NO:22294}   Ideas of Reference:  {BHH YES OR NO:22294}  PTSD Symptoms: Ever had a traumatic exposure:  {BHH YES OR NO:22294} Had a traumatic exposure in the last month:  {BHH YES OR NO:22294} Re-experiencing: {BHH YES OR NO:22294} {Re-experiencing:22673} Hypervigilance:  {BHH YES OR NO:22294} Hyperarousal: {BHH YES OR NO:22294} {Hyperarousal:22674} Avoidance: {BHH YES OR NO:22294} {Avoidance:22675}  Traumatic Brain Injury: {BHH YES OR NO:22294} {Traumatic Brain Injury:22676}  Past Psychiatric History: Diagnosis: ***  Hospitalizations: ***  Outpatient Care: ***  Substance Abuse Care: ***  Self-Mutilation: ***  Suicidal Attempts: ***  Violent Behaviors: ***   Past Medical History:   Past  Medical History  Diagnosis Date  . Sjogren's disease   . Raynaud's disease    History of Loss of Consciousness:  {BHH YES OR NO:22294} Seizure History:  {BHH YES OR NO:22294} Cardiac History:  {BHH YES OR NO:22294} Allergies:  No Known Allergies Current Medications:  No current outpatient prescriptions on file.   No current facility-administered medications for this visit.    Previous Psychotropic Medications:  Medication Dose   ***  ***                     Substance Abuse History in the last 12 months: Substance Age of 1st Use Last Use Amount Specific Type  Nicotine  ***  ***  ***  ***  Alcohol  ***  ***  ***  ***  Cannabis  ***  ***  ***  ***  Opiates  ***  ***  ***  ***  Cocaine  ***  ***  ***  ***  Methamphetamines  ***  ***  ***  ***  LSD  ***  ***  ***  ***  Ecstasy  ***   ***  ***  ***  Benzodiazepines  ***  ***  ***  ***  Caffeine  ***  ***  ***  ***  Inhalants  ***  ***  ***  ***  Others:                          Medical Consequences of Substance Abuse: ***  Legal Consequences of Substance Abuse: ***  Family Consequences of Substance Abuse: ***  Blackouts:  {BHH YES OR NO:22294} DT's:  {BHH YES OR WU:98119}O:22294} Withdrawal Symptoms:  {BHH YES OR NO:22294} {Withdrawal Symptoms:22677}  Social History:  Current Place of Residence: *** Place of Birth: *** Family Members: *** Marital Status:  {Marital Status:22678} Children: ***  Sons: ***  Daughters: *** Relationships: *** Education:  {Education:22679} Educational Problems/Performance: *** Religious Beliefs/Practices: *** History of Abuse: {Desc; abuse:16542} Occupational Experiences; Military History:  {Military History:22680} Legal History: *** Hobbies/Interests: ***  Family History:  No family history on file.  Mental Status Examination/Evaluation: Objective:  Appearance: {Appearance:22683}  Eye Contact::  {BHH EYE CONTACT:22684}  Speech:  {Speech:22685}  Volume:  {Volume (PAA):22686}   Mood:  ***  Affect:  {Affect (PAA):22687}  Thought Process:  {Thought Process (PAA):22688}  Orientation:  {BHH ORIENTATION (PAA):22689}  Thought Content:  {Thought Content:22690}  Suicidal Thoughts:  {ST/HT (PAA):22692}  Homicidal Thoughts:  {ST/HT (PAA):22692}  Judgement:  {Judgement (PAA):22694}  Insight:  {Insight (PAA):22695}  Psychomotor Activity:  {Psychomotor (PAA):22696}  Akathisia:  {BHH YES OR NO:22294}  Handed:  {Handed:22697}  AIMS (if indicated):  ***  Assets:  {Assets (PAA):22698}    Laboratory/X-Ray Psychological Evaluation(s)   ***  ***   Assessment:  {axis diagnosis:3049000}  AXIS I {psych axis 1:31909}  AXIS II {psych axis 2:31910}  AXIS III Past Medical History  Diagnosis Date  . Sjogren's disease   . Raynaud's disease      AXIS IV {psych axis iv:31915}  AXIS V {psych axis v score:31919}   Treatment Plan/Recommendations:  Plan of Care: ***  Laboratory:  {Laboratory:22682}  Psychotherapy: ***  Medications: ***  Routine PRN Medications:  {BHH YES OR NO:22294}  Consultations: ***  Safety Concerns:  ***  Other:      Livianna Petraglia, PA-C 4/22/201611:03 AM

## 2014-11-03 ENCOUNTER — Encounter (HOSPITAL_COMMUNITY): Payer: Self-pay | Admitting: Physician Assistant

## 2014-11-03 NOTE — Patient Instructions (Signed)
1. Patient will see PCP for EKG, blood work, and sleep study. 2. Will call with results.
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
## 2014-11-05 ENCOUNTER — Ambulatory Visit (HOSPITAL_COMMUNITY): Payer: Medicaid Other | Admitting: Physician Assistant

## 2015-09-07 ENCOUNTER — Encounter: Payer: Self-pay | Admitting: *Deleted

## 2015-09-16 ENCOUNTER — Other Ambulatory Visit: Payer: Self-pay | Admitting: General Practice

## 2015-09-16 ENCOUNTER — Other Ambulatory Visit (HOSPITAL_COMMUNITY)
Admission: RE | Admit: 2015-09-16 | Discharge: 2015-09-16 | Disposition: A | Discharge status: Home / Self Care | Payer: PRIVATE HEALTH INSURANCE | Source: Ambulatory Visit | Attending: Obstetrics and Gynecology | Admitting: Obstetrics and Gynecology

## 2015-09-16 ENCOUNTER — Ambulatory Visit (INDEPENDENT_AMBULATORY_CARE_PROVIDER_SITE_OTHER): Payer: Medicaid Other | Admitting: Obstetrics and Gynecology

## 2015-09-16 ENCOUNTER — Encounter: Payer: Self-pay | Admitting: Obstetrics and Gynecology

## 2015-09-16 VITALS — BP 95/57 | HR 64 | Temp 98.4°F | Ht 60.0 in | Wt 118.0 lb

## 2015-09-16 DIAGNOSIS — R87615 Unsatisfactory cytologic smear of cervix: Secondary | ICD-10-CM | POA: Insufficient documentation

## 2015-09-16 LAB — POCT URINALYSIS DIP (DEVICE)
Bilirubin Urine: NEGATIVE
GLUCOSE, UA: NEGATIVE mg/dL
Hgb urine dipstick: NEGATIVE
KETONES UR: NEGATIVE mg/dL
Leukocytes, UA: NEGATIVE
Nitrite: NEGATIVE
PROTEIN: NEGATIVE mg/dL
Urobilinogen, UA: 0.2 mg/dL (ref 0.0–1.0)
pH: 6.5 (ref 5.0–8.0)

## 2015-09-16 NOTE — Progress Notes (Signed)
Patient ID: Cheryl White, female   DOB: 02/04/1961, 55 y.o.   MRN: [id number] 55 yo with unsatisfactory pap smear in 04/2015 and 08/2015 presenting today for colposcopy  Patient given informed consent, signed copy in the chart, time out was performed.  Placed in lithotomy position. Cervix viewed with speculum and colposcope after application of acetic acid.   Colposcopy adequate?  no Acetowhite lesions?no Punctation?no Mosaicism?  no Abnormal vasculature?  no Biopsies?no ECC?yes  COMMENTS: Patient was given post procedure instructions.  She will return in 2 weeks for results.  Catalina AntiguaONSTANT,Demontay Grantham, MD

## 2015-09-21 ENCOUNTER — Telehealth: Payer: Self-pay

## 2015-09-21 NOTE — Telephone Encounter (Signed)
Pt has been informed of pap smear results and to returned in 1 year

## 2016-09-29 ENCOUNTER — Emergency Department (HOSPITAL_BASED_OUTPATIENT_CLINIC_OR_DEPARTMENT_OTHER)
Admission: EM | Admit: 2016-09-29 | Discharge: 2016-09-29 | Disposition: A | Discharge status: Home / Self Care | Payer: Medicaid Other | Attending: Emergency Medicine | Admitting: Emergency Medicine

## 2016-09-29 ENCOUNTER — Encounter (HOSPITAL_BASED_OUTPATIENT_CLINIC_OR_DEPARTMENT_OTHER): Payer: Self-pay | Admitting: Emergency Medicine

## 2016-09-29 ENCOUNTER — Emergency Department (HOSPITAL_BASED_OUTPATIENT_CLINIC_OR_DEPARTMENT_OTHER): Payer: Medicaid Other

## 2016-09-29 DIAGNOSIS — R1013 Epigastric pain: Secondary | ICD-10-CM | POA: Insufficient documentation

## 2016-09-29 LAB — COMPREHENSIVE METABOLIC PANEL
ALT: 24 U/L (ref 14–54)
AST: 32 U/L (ref 15–41)
Albumin: 4.8 g/dL (ref 3.5–5.0)
Alkaline Phosphatase: 163 U/L — ABNORMAL HIGH (ref 38–126)
Anion gap: 8 (ref 5–15)
BUN: 8 mg/dL (ref 6–20)
CHLORIDE: 103 mmol/L (ref 101–111)
CO2: 29 mmol/L (ref 22–32)
CREATININE: 0.62 mg/dL (ref 0.44–1.00)
Calcium: 9.7 mg/dL (ref 8.9–10.3)
GFR calc non Af Amer: >60 mL/min (ref >60)
Glucose, Bld: 84 mg/dL (ref 65–99)
Potassium: 3.7 mmol/L (ref 3.5–5.1)
SODIUM: 140 mmol/L (ref 135–145)
Total Bilirubin: 0.5 mg/dL (ref 0.3–1.2)
Total Protein: 8 g/dL (ref 6.5–8.1)

## 2016-09-29 LAB — LIPASE, BLOOD: Lipase: 34 U/L (ref 11–51)

## 2016-09-29 LAB — CBC WITH DIFFERENTIAL/PLATELET
Basophils Absolute: 0 10*3/uL (ref 0.0–0.1)
Basophils Relative: 0 %
EOS PCT: 3 %
Eosinophils Absolute: 0.1 10*3/uL (ref 0.0–0.7)
HCT: 41.5 % (ref 36.0–46.0)
Hemoglobin: 13.9 g/dL (ref 12.0–15.0)
LYMPHS ABS: 1.4 10*3/uL (ref 0.7–4.0)
LYMPHS PCT: 37 %
MCH: 27.7 pg (ref 26.0–34.0)
MCHC: 33.5 g/dL (ref 30.0–36.0)
MCV: 82.7 fL (ref 78.0–100.0)
Monocytes Absolute: 0.3 10*3/uL (ref 0.1–1.0)
Monocytes Relative: 8 %
NEUTROS ABS: 1.9 10*3/uL (ref 1.7–7.7)
Neutrophils Relative %: 52 %
PLATELETS: 166 10*3/uL (ref 150–400)
RBC: 5.02 MIL/uL (ref 3.87–5.11)
RDW: 13.3 % (ref 11.5–15.5)
WBC: 3.7 10*3/uL — AB (ref 4.0–10.5)

## 2016-09-29 MED ORDER — HYDROCODONE-ACETAMINOPHEN 5-325 MG PO TABS: 1.0000 | ORAL_TABLET | Freq: Four times a day (QID) | ORAL | 0 refills | Status: DC | PRN

## 2016-09-29 MED ORDER — GI COCKTAIL ~~LOC~~
30.0000 mL | Freq: Once | ORAL | Status: AC
  Administered 2016-09-29: 30 mL via ORAL
  Filled 2016-09-29: qty 30

## 2016-09-29 MED ORDER — SODIUM CHLORIDE 0.9 % IV BOLUS (SEPSIS)
1000.0000 mL | Freq: Once | INTRAVENOUS | Status: AC
  Administered 2016-09-29: 1000 mL via INTRAVENOUS

## 2016-09-29 NOTE — ED Provider Notes (Signed)
[facility]   CSN: [id number] Arrival date & time: 09/29/16  1602     History   Chief Complaint Chief Complaint  Patient presents with  . Abdominal Pain    epigastric pain     HPI Cheryl White is a 56 y.o. female.  Patient is a 56 year old female with history of lupus. She presents today for evaluation of epigastric pain. This is been present for the past 2 days and is worsening. This seems to be worse when she eats. She denies any vomiting but does report nausea. She denies any fevers or chills. She denies any prior abdominal surgery.   The history is provided by the patient.  Abdominal Pain   This is a new problem. The current episode started 2 days ago. The problem occurs constantly. The problem has been gradually worsening. The pain is associated with eating. The pain is located in the epigastric region. The quality of the pain is cramping. The pain is moderate. Pertinent negatives include fever, diarrhea, hematochezia, melena, vomiting and constipation. The symptoms are aggravated by eating. Nothing relieves the symptoms.    Past Medical History:  Diagnosis Date  . Raynaud's disease   . Sjogren's disease [facility])     Patient Active Problem List   Diagnosis Date Noted  . SJOGREN'S SYNDROME 05/09/2007  . SHOULDER PAIN, RIGHT 05/09/2007  . NECK PAIN, RIGHT 05/09/2007    Past Surgical History:  Procedure Laterality Date  . BREAST SURGERY    . CESAREAN SECTION      OB History    No data available       Home Medications    Prior to Admission medications   Not on File    Family History History reviewed. No pertinent family history.  Social History Social History  Substance Use Topics  . Smoking status: Never Smoker  . Smokeless tobacco: Never Used  . Alcohol use No     Allergies   Patient has no known allergies.   Review of Systems Review of Systems  Constitutional: Negative for fever.  Gastrointestinal: Positive for  abdominal pain. Negative for constipation, diarrhea, hematochezia, melena and vomiting.  All other systems reviewed and are negative.    Physical Exam Updated Vital Signs BP 110/69 (BP Location: Right Arm)   Pulse 84   Temp 98.7 F (37.1 C) (Oral)   Resp 18   Ht 5\' 4"  (1.626 m)   Wt 120 lb (54.4 kg)   SpO2 100%   BMI 20.60 kg/m   Physical Exam  Constitutional: She is oriented to person, place, and time. She appears well-developed and well-nourished. No distress.  HENT:  Head: Normocephalic and atraumatic.  Neck: Normal range of motion. Neck supple.  Cardiovascular: Normal rate and regular rhythm.  Exam reveals no gallop and no friction rub.   No murmur heard. Pulmonary/Chest: Effort normal and breath sounds normal. No respiratory distress. She has no wheezes.  Abdominal: Soft. Bowel sounds are normal. She exhibits no distension. There is tenderness. There is no rebound and no guarding.  There is mild tenderness to palpation in the epigastric region.  Musculoskeletal: Normal range of motion.  Neurological: She is alert and oriented to person, place, and time.  Skin: Skin is warm and dry. She is not diaphoretic.  Nursing note and vitals reviewed.    ED Treatments / Results  Labs (all labs ordered are listed, but only abnormal results are displayed) Labs Reviewed  COMPREHENSIVE METABOLIC PANEL  LIPASE, BLOOD  CBC WITH DIFFERENTIAL/PLATELET    EKG  EKG Interpretation None       Radiology No results found.  Procedures Procedures (including critical care time)  Medications Ordered in ED Medications  sodium chloride 0.9 % bolus 1,000 mL (not administered)  gi cocktail (Maalox,Lidocaine,Donnatal) (not administered)     Initial Impression / Assessment and Plan / ED Course  I have reviewed the triage vital signs and the nursing notes.  Pertinent labs & imaging results that were available during my care of the patient were reviewed by me and considered in my  medical decision making (see chart for details).  Patient presents with epigastric pain. She's been having flareups of this same discomfort off and on for a couple of years. She's never been seen by a gastroenterologist, but has been to the emergency room. Her workup today reveals no acute abnormality. She has no white count, lipase and LFTs are normal, and ultrasound fails to reveal any gallstones or significant pathology. She is feeling somewhat better after a GI cocktail. She will be discharged with continued Nexium, pain medicine, and is to follow-up with gastroenterology as an outpatient.  Final Clinical Impressions(s) / ED Diagnoses   Final diagnoses:  Epigastric pain    New Prescriptions New Prescriptions   No medications on file     Geoffery Lyonsouglas Jenalyn Girdner, MD 09/29/16 1950

## 2016-09-29 NOTE — Discharge Instructions (Signed)
Continue your Nexium as previously prescribed.  Hydrocodone as prescribed as needed for pain.  Follow-up with gastroenterology. The contact information for Dr. Marina GoodellPerry has been provided in this discharge summary for you to call and make these arrangements.

## 2016-09-29 NOTE — ED Triage Notes (Signed)
Patient states that she is having pain to her epigastric region and it burns up into her throat

## 2016-09-29 NOTE — ED Notes (Signed)
Patient ambulatory to restroom. Educated on collection of clean catch urine specimen.

## 2017-05-09 ENCOUNTER — Ambulatory Visit (INDEPENDENT_AMBULATORY_CARE_PROVIDER_SITE_OTHER): Payer: Medicaid Other | Admitting: Internal Medicine

## 2017-05-09 ENCOUNTER — Encounter: Payer: Self-pay | Admitting: Internal Medicine

## 2017-05-09 DIAGNOSIS — F411 Generalized anxiety disorder: Secondary | ICD-10-CM | POA: Diagnosis not present

## 2017-05-09 DIAGNOSIS — Z0000 Encounter for general adult medical examination without abnormal findings: Secondary | ICD-10-CM | POA: Insufficient documentation

## 2017-05-09 DIAGNOSIS — I7300 Raynaud's syndrome without gangrene: Secondary | ICD-10-CM

## 2017-05-09 DIAGNOSIS — Z114 Encounter for screening for human immunodeficiency virus [HIV]: Secondary | ICD-10-CM

## 2017-05-09 DIAGNOSIS — M329 Systemic lupus erythematosus, unspecified: Secondary | ICD-10-CM

## 2017-05-09 DIAGNOSIS — Z1159 Encounter for screening for other viral diseases: Secondary | ICD-10-CM | POA: Diagnosis not present

## 2017-05-09 NOTE — Patient Instructions (Signed)
I am going to blood work today. I want you to come back in 1 week to discuss other issues.

## 2017-05-09 NOTE — Progress Notes (Signed)
   [facility] Noralee CharsAsiyah Elverta Dimiceli, MD Phone: [phone]  Reason For Visit: Establish Care   #Systemic lupus -Patient has a history of raynaud, dry mouth and dry eyes.  Patient does get a photosensitive rash and rash on her lower legs at times. -had a elevated Prentiss - patient was seen by [facility] in 2017 - Patient was placed on Planequil at that time in 2017 -States she never took this medication as she was afraid of possible side effects.  Per patient she has a lot of anxiety and she gets panic attacks at times. -[facility] she states she was referred there   Past Medical History Reviewed problem list.  Medications- reviewed and updated No additions to family history Social history- patient is a  Non- smoker  Objective: BP 96/64   Pulse (!) 54   Temp 98 F (36.7 C) (Oral)   Ht 5\' 4"  (1.626 m)   Wt 118 lb 12.8 oz (53.9 kg)   SpO2 100%   BMI 20.39 kg/m  Gen: NAD, alert, cooperative with exam HEENT: Normal    Neck: No masses palpated. No lymphadenopathy    Ears: Tympanic membranes intact, normal light reflex, no erythema, no bulging    Eyes: PERRLA, EOMI    Nose: nasal turbinates moist    Throat: moist mucus membranes, no erythema Cardio: regular rate and rhythm, S1S2 heard, no murmurs appreciated Pulm: clear to auscultation bilaterally, no wheezes, rhonchi or rales GI: soft, non-tender, non-distended, bowel sounds present, no hepatomegaly, no splenomegaly Extremities: warm, well perfused, No edema, cyanosis or clubbing;  MSK: Normal gait and station Skin: dry, intact, no rashes or lesions  Patient reports no  vision/ hearing changes,anorexia, weight change, fever ,adenopathy, persistant / recurrent hoarseness, swallowing issues, chest pain, edema,persistant / recurrent cough, hemoptysis, dyspnea(rest, exertional, paroxysmal nocturnal), gastrointestinal  bleeding (melena, rectal bleeding), abdominal  pain, excessive heart burn, GU symptoms(dysuria, hematuria, pyuria, voiding/incontinence  Issues) syncope, focal weakness, severe memory loss, concerning skin lesions, depression, anxiety, abnormal bruising/bleeding, major joint swelling, breast masses or abnormal vaginal bleeding.    Assessment/Plan: See problem based a/p  Lupus (systemic lupus erythematosus) (HCC) [facility]  She is not currently on any immunosuppressive medications.

## 2017-05-09 NOTE — Assessment & Plan Note (Signed)
[facility]  She is not currently on any immunosuppressive medications.

## 2017-05-10 LAB — LIPID PANEL
CHOL/HDL RATIO: 5 ratio — AB (ref 0.0–4.4)
Cholesterol, Total: 191 mg/dL (ref 100–199)
HDL: 38 mg/dL — AB (ref >39)
LDL CALC: 121 mg/dL — AB (ref 0–99)
TRIGLYCERIDES: 159 mg/dL — AB (ref 0–149)
VLDL Cholesterol Cal: 32 mg/dL (ref 5–40)

## 2017-05-10 LAB — CMP14+EGFR
ALT: 16 IU/L (ref 0–32)
AST: 18 IU/L (ref 0–40)
Albumin/Globulin Ratio: 1.6 (ref 1.2–2.2)
Albumin: 4.7 g/dL (ref 3.5–5.5)
Alkaline Phosphatase: 169 IU/L — ABNORMAL HIGH (ref 39–117)
BUN/Creatinine Ratio: 11 (ref 9–23)
BUN: 6 mg/dL (ref 6–24)
Bilirubin Total: 0.3 mg/dL (ref 0.0–1.2)
CO2: 25 mmol/L (ref 20–29)
CREATININE: 0.54 mg/dL — AB (ref 0.57–1.00)
Calcium: 9.4 mg/dL (ref 8.7–10.2)
Chloride: 103 mmol/L (ref 96–106)
GFR calc Af Amer: 123 mL/min/{1.73_m2} (ref >59)
GFR calc non Af Amer: 107 mL/min/{1.73_m2} (ref >59)
GLUCOSE: 83 mg/dL (ref 65–99)
Globulin, Total: 3 g/dL (ref 1.5–4.5)
Potassium: 4.1 mmol/L (ref 3.5–5.2)
Sodium: 143 mmol/L (ref 134–144)
Total Protein: 7.7 g/dL (ref 6.0–8.5)

## 2017-05-10 LAB — HEPATITIS C ANTIBODY (REFLEX): HCV Ab: 0.1 s/co ratio (ref 0.0–0.9)

## 2017-05-10 LAB — CBC
Hematocrit: 42.6 % (ref 34.0–46.6)
Hemoglobin: 13.6 g/dL (ref 11.1–15.9)
MCH: 26.4 pg — ABNORMAL LOW (ref 26.6–33.0)
MCHC: 31.9 g/dL (ref 31.5–35.7)
MCV: 83 fL (ref 79–97)
Platelets: 194 10*3/uL (ref 150–379)
RBC: 5.16 x10E6/uL (ref 3.77–5.28)
RDW: 13.5 % (ref 12.3–15.4)
WBC: 3.5 10*3/uL (ref 3.4–10.8)

## 2017-05-10 LAB — HCV COMMENT:

## 2017-05-10 LAB — HIV ANTIBODY (ROUTINE TESTING W REFLEX): HIV SCREEN 4TH GENERATION: NONREACTIVE

## 2017-05-25 ENCOUNTER — Ambulatory Visit (HOSPITAL_COMMUNITY)
Admission: RE | Admit: 2017-05-25 | Discharge: 2017-05-25 | Disposition: A | Discharge status: Home / Self Care | Payer: Medicaid Other | Source: Ambulatory Visit | Attending: Family Medicine | Admitting: Family Medicine

## 2017-05-25 ENCOUNTER — Ambulatory Visit (INDEPENDENT_AMBULATORY_CARE_PROVIDER_SITE_OTHER): Payer: Medicaid Other | Admitting: Internal Medicine

## 2017-05-25 VITALS — BP 99/60 | HR 65 | Temp 98.2°F | Ht 61.0 in | Wt 123.0 lb

## 2017-05-25 DIAGNOSIS — R001 Bradycardia, unspecified: Secondary | ICD-10-CM | POA: Insufficient documentation

## 2017-05-25 DIAGNOSIS — R0789 Other chest pain: Secondary | ICD-10-CM | POA: Insufficient documentation

## 2017-05-25 DIAGNOSIS — Z87448 Personal history of other diseases of urinary system: Secondary | ICD-10-CM | POA: Diagnosis present

## 2017-05-25 DIAGNOSIS — R1084 Generalized abdominal pain: Secondary | ICD-10-CM

## 2017-05-25 MED ORDER — DOCUSATE SODIUM 100 MG PO CAPS: 100.0000 mg | ORAL_CAPSULE | Freq: Two times a day (BID) | ORAL | 0 refills | Status: DC

## 2017-05-25 MED ORDER — PANTOPRAZOLE SODIUM 40 MG PO TBEC: 40.0000 mg | DELAYED_RELEASE_TABLET | Freq: Every day | ORAL | 3 refills | Status: DC

## 2017-05-25 MED ORDER — POLYETHYLENE GLYCOL 3350 17 GM/SCOOP PO POWD: 17.0000 g | Freq: Two times a day (BID) | ORAL | 1 refills | Status: DC | PRN

## 2017-05-25 NOTE — Assessment & Plan Note (Signed)
Patient with generalized abdominal pain,she does not note specifically anywhere no history of surgeries on her belly patient has had a colonoscopy and endoscopy done about 2011.  She does indicate having very hard bowel movements and a history of constipation she denies any other significant symptoms. She states that this pain is intermittent and not really associated with anything specifically is been going on for about 1 year -Given history of constipation will get a abdominal x-ray -Will give patient MiraLAX and Colace -Have her follow-up in about 2 weeks

## 2017-05-25 NOTE — Patient Instructions (Signed)
It was nice to meet you today.  I want to to start taking Protonix as this burning sensation/chest pain may be reflux.  We will check an EKG today just to make sure there is nothing on there that is concerning.  I also want you to start MiraLAX and Colace to help with the constipation.  Please follow-up with me in about 2 weeks.  Dieta rica en fibra (High-Fiber Diet) La fibra, tambin llamada fibra dietaria, es un tipo de carbohidrato que se encuentra en las frutas, las verduras, los cereales integrales y los frijoles. Una dieta rica en fibra puede tener muchos beneficios para la salud. El mdico puede recomendar una dieta rica en fibra para ayudar a:  Chief Strategy Officervitar el estreimiento. La fibra puede hacer que defeque con ms frecuencia.  Disminuir el nivel de colesterol.  Aliviar las hemorroides, la diverticulosis no complicada o el sndrome del intestino irritable.  Evitar comer en exceso como parte de un plan para bajar de peso.  Evitar cardiopatas, la diabetes tipo 2 y ciertos cnceres. EN QU CONSISTE EL PLAN? El consumo diario recomendado de fibra incluye lo siguiente:  38gramos para hombres menores de 50 aos.  30gramos para hombres mayores de [place]50 aos.  25gramos para mujeres menores de 50 aos.  21gramos para mujeres mayores de [place]50 aos. Puede lograr el consumo diario recomendado de fibra si come una variedad de frutas, verduras, cereales y frijoles. El mdico tambin puede recomendar un suplemento de fibra si no es posible obtener suficiente fibra a travs de la dieta. QU DEBO SABER ACERCA DE LA DIETA RICA EN FIBRA?  La eficacia de los suplementos de Bushnellfibra no ha sido estudiada Gamewellampliamente, de modo que es mejor obtener fibra a travs de los alimentos.  Verifique siempre el contenido de fibra en la etiqueta de informacin nutricional de los alimentos preenvasados. Busque alimentos que contengan al menos 5gramos de fibra por porcin.  Consulte al nutricionista si tiene preguntas  sobre algunos alimentos especficos relacionados con su enfermedad, especialmente si estos alimentos no se mencionan a continuacin.  Aumente el consumo diario de fibra en forma gradual. Aumentar demasiado rpido el consumo de fibra dietaria puede provocar meteorismo, clicos o gases.  Beber abundante agua. El [place]agua ayuda a Geophysicist/field seismologistdigerir la fibra.  QU ALIMENTOS PUEDO COMER? Cereales Panes integrales. Multicereales. Avena. Arroz integral. Gypsy Decantebada. Trigo burgol. Mijo. Muffins de salvado. Palomitas de maz. Galletas de centeno. Verduras Batatas. Espinaca. Col rizada. Alcachofas. Repollo. Brcoli. Guisantes. Zanahorias. Calabaza. Frutas Frutos rojos. Peras. Manzanas. Naranjas Aguacates. Ciruelas y pasas. Higos secos. Carnes y otras fuentes de protenas Frijoles blancos, colorados, pintos y porotos de soja. Guisantes secos. Lentejas. Frutos secos y semillas. Lcteos Yogur fortificado con Research scientist (life sciences)fibra. Bebidas Leche de soja fortificada con Bjorn Loserfibra. Jugo de naranja fortificado con Bjorn Loserfibra. Otros Barras de Good Hopefibra. Los artculos mencionados arriba pueden no ser Raytheonuna lista completa de las bebidas o los alimentos recomendados. Comunquese con el nutricionista para conocer ms opciones. QU ALIMENTOS NO SE RECOMIENDAN? Cereales Pan blanco. Pastas hechas con Webb Lawsharina refinada. Arroz blanco. Verduras Papas fritas. Verduras enlatadas. Verduras bien cocidas. Frutas Jugo de frutas. Frutas cocidas coladas. Carnes y [address]ras fuentes de protenas Cortes de carne con Holiday representativegrasa. Aves o pescados fritos. Lcteos Leche. Yogur. Queso crema. PPG IndustriesCrema cida. Bebidas Gaseosas. Otros Tortas y pasteles. Mantequilla y aceites. Los artculos mencionados arriba pueden no ser Raytheonuna lista completa de las bebidas y los alimentos que se Theatre stage managerdeben evitar. Comunquese con el nutricionista para obtener ms informacin. ALGUNOS CONSEJOS PARA INCLUIR ALIMENTOS RICOS EN FIBRA  EN LA DIETA  Consuma una gran variedad de alimentos ricos en fibra.  Asegrese  de que la mitad de todos los cereales consumidos cada da sean cereales integrales.  Reemplace los panes y cereales hechos de harina refinada o harina blanca por panes y cereales integrales.  Reemplace el arroz blanco por arroz integral, trigo burgol o mijo.  Comience Medical laboratory scientific officerel da con un desayuno rico en Bogatafibra, como un cereal que contenga al menos 5gramos de fibra por porcin.  Use guisantes en lugar de carne en las sopas, ensaladas o pastas.  Coma bocadillos ricos en fibra, como frutos rojos, verduras crudas, frutos secos o palomitas de maz.  Esta informacin no tiene Theme park managercomo fin reemplazar el consejo del mdico. Asegrese de hacerle al mdico cualquier pregunta que tenga. Document Released: 06/26/2005 Document Revised: 10/18/2015 Document Reviewed: 12/09/2013 Elsevier Interactive Patient Education  2017 [employer]

## 2017-05-25 NOTE — Progress Notes (Signed)
   [facility] Noralee CharsAsiyah Mikell, MD Phone: [phone]  Reason For Visit: Follow up   # Abdominal Pain  - has abdominal pain on both sides  - patient has had this pain for about 1 year - has worsened recently  - pain is intermittent  - No hx of surgies on her belly  - Patient had endoscopy and colonoscopy about 10 years ago in 2011 - looked like patient had an ulcer at that time   Symptoms Nausea/vomiting: None  Diarrhea: Constipation: Lately - has small bowel movement everyday, bowel movement is hard   Blood in stool: none  Fever: None  Dysuria: None  Loss of appetite: None  Weight loss: None  Post-menopausal x 10 years   # Chest pain  - Has had from many years  - patient feels like she has a needle here, burning sensation - Last for about 20- 30 secs  - Does not move anywhere - Many years ago patient was checked, 1 year ago patient has stress test several years ago  - Patient feels it is worse with emotions- at church  - Denies any association with activity -Denies any association with food - Denies any nausea, vomiting, diaphoresis  Past Medical History Reviewed problem list.  Medications- reviewed and updated No additions to family history Social history- patient is a  Non-smoker  Objective: BP 99/60   Pulse 65   Temp 98.2 F (36.8 C) (Oral)   Ht 5\' 1"  (1.549 m)   Wt 123 lb (55.8 kg)   SpO2 99%   BMI 23.24 kg/m  Gen: NAD, alert, cooperative with exam Cardio: regular rate and rhythm, S1S2 heard, no murmurs appreciated Pulm: clear to auscultation bilaterally, no wheezes, rhonchi or rales GI: soft, non-tender, non-distended, bowel sounds present, no hepatomegaly, no splenomegaly Extremities: warm, well perfused, No edema, cyanosis or clubbing;  Skin: dry, intact, no rashes or lesions    Assessment/Plan: See problem based a/p  Atypical chest pain Atypical chest pain this could be GERD given patient describes it as a burning  sensation, patient does have a history of an antral ulcer in the past. Not currently on any reflux medications. EKG is within normal limits Patient states she is been seen for stress test which was negative though not noted in her records -Will start patient on Protonix and have her follow-up in 2 weeks   Generalized abdominal pain Patient with generalized abdominal pain,she does not note specifically anywhere no history of surgeries on her belly patient has had a colonoscopy and endoscopy done about 2011.  She does indicate having very hard bowel movements and a history of constipation she denies any other significant symptoms. She states that this pain is intermittent and not really associated with anything specifically is been going on for about 1 year -Given history of constipation will get a abdominal x-ray -Will give patient MiraLAX and Colace -Have her follow-up in about 2 weeks

## 2017-05-25 NOTE — Assessment & Plan Note (Addendum)
Atypical chest pain this could be GERD given patient describes it as a burning sensation, patient does have a history of an antral ulcer in the past. Not currently on any reflux medications. EKG is within normal limits Patient states she is been seen for stress test which was negative though not noted in her records -Will start patient on Protonix and have her follow-up in 2 weeks

## 2017-05-30 ENCOUNTER — Encounter: Payer: Self-pay | Admitting: Internal Medicine

## 2017-06-08 ENCOUNTER — Encounter: Payer: Self-pay | Admitting: Internal Medicine

## 2017-06-08 ENCOUNTER — Other Ambulatory Visit: Payer: Self-pay

## 2017-06-08 ENCOUNTER — Ambulatory Visit: Payer: Medicaid Other | Admitting: Internal Medicine

## 2017-06-08 VITALS — BP 92/68 | HR 68 | Temp 98.5°F | Wt 122.0 lb

## 2017-06-08 DIAGNOSIS — M329 Systemic lupus erythematosus, unspecified: Secondary | ICD-10-CM | POA: Diagnosis present

## 2017-06-08 DIAGNOSIS — Z0000 Encounter for general adult medical examination without abnormal findings: Secondary | ICD-10-CM

## 2017-06-08 DIAGNOSIS — Z1231 Encounter for screening mammogram for malignant neoplasm of breast: Secondary | ICD-10-CM | POA: Diagnosis not present

## 2017-06-08 DIAGNOSIS — R0789 Other chest pain: Secondary | ICD-10-CM

## 2017-06-08 DIAGNOSIS — R1084 Generalized abdominal pain: Secondary | ICD-10-CM | POA: Diagnosis not present

## 2017-06-08 DIAGNOSIS — J309 Allergic rhinitis, unspecified: Secondary | ICD-10-CM | POA: Diagnosis not present

## 2017-06-08 MED ORDER — CETIRIZINE HCL 10 MG PO TABS: 10.0000 mg | ORAL_TABLET | Freq: Every day | ORAL | 5 refills | Status: DC

## 2017-06-08 MED ORDER — OLOPATADINE HCL 0.2 % OP SOLN: 1.0000 [drp] | Freq: Two times a day (BID) | OPHTHALMIC | 0 refills | Status: DC

## 2017-06-08 MED ORDER — FLUTICASONE PROPIONATE 50 MCG/ACT NA SUSP: 2.0000 | Freq: Every day | NASAL | 6 refills | Status: DC

## 2017-06-08 NOTE — Progress Notes (Signed)
   [facility] Cheryl CharsAsiyah Yvette Roark, MD Phone: [phone]  Reason For Visit: Follow up   # Abdominal Pain/ reflux:  -Patient has been eating more fruits and vegetables which has helped significantly  -Patient denies any abdominal pain or atypical reflux chest pain since starting medications that I prescribed her at last visit; -She has been taking Protonix daily, MiraLAX daily and Colace twice daily  -She has been having regular soft bowel movements, she denies any abdominal pain denies any bloating -She does feel that 1 of the medications makes her itchy and have watery eyes -she is not sure which medication as she has been taking all of them  # Women's Health  - Patient seen's a specific gynecologist for pap smear   -Patient is interested in getting her screening mammogram -Patient is also interested in getting her screening colonoscopy she thinks she has had one about 10 years ago.  She is worried because she has had a couple reactions to medications one at [facility] and one with her dentist.  She wants to provide me with these medications before she goes for the screening.   #Allergic rhinitis - Has hx of sinus congestion and seasonal allergies  -Lately has been having watery eyes and watery nasal congestion -She has been sniffing a lot -Denies any fevers, cough, chills   Past Medical History Reviewed problem list.  Medications- reviewed and updated No additions to family history Social history- patient is a non- smoker  Objective: BP 92/68   Pulse 68   Temp 98.5 F (36.9 C) (Oral)   Wt 122 lb (55.3 kg)   SpO2 98%   BMI 23.05 kg/m  Gen: NAD, alert, cooperative with exam HEENT: Normal    Neck: No masses palpated. No lymphadenopathy    Ears: Tympanic membranes intact,     Eyes: PERRLA,    Nose: nasal turbinates moist congested     Throat: moist mucus membranes, no erythema Cardio: regular rate and rhythm, S1S2 heard, no murmurs  appreciated Pulm: clear to auscultation bilaterally, no wheezes, rhonchi or rales GI: soft, non-tender, non-distended, bowel sounds present,  Skin: dry, intact,    Assessment/Plan: See problem based a/p  Atypical chest pain Since starting Protonix atypical chest pain has resolved.  She patient likely with reflux.  Is going to stop taking this medication as she thinks it makes her itchy.  Advised her that if the symptoms she was having come back to either restarted or call me to discuss another option  Generalized abdominal pain Abdominal pain has resolved with regular bowel movements and protocol of MiraLAX and Colace  Healthcare maintenance Needs a screening mammogram and colonoscopy Patient is concerned about an allergic reaction she had to some medications at outside hospitals and states she wants to get this information before going for colonoscopy Referral placed for patient to see her OB/GYN Dr. Dareen PianoAnderson for Pap smear  Allergic rhinitis  Symptoms of allergic rhinitis.  Treat as follows - Olopatadine HCl (PATADAY) 0.2 % SOLN; Apply 1 drop to eye 2 (two) times daily.  Dispense: 1 Bottle; Refill: 0 - fluticasone (FLONASE) 50 MCG/ACT nasal spray; Place 2 sprays into both nostrils daily.  Dispense: 16 g; Refill: 6 - cetirizine (ZYRTEC) 10 MG tablet; Take 1 tablet (10 mg total) by mouth daily.  Dispense: 30 tablet; Refill: 5 -Follow up in two weeks

## 2017-06-08 NOTE — Patient Instructions (Signed)
It was nice seeing you today.  I am happy that your symptoms have improved since last visit.  I would consider stopping the Protonix first as this may be the medication that is causing you symptoms and see how you do off of that medication.  We can always change you to a different medication if that is the cause of the itchiness that you have been feeling.  I am going to prescribe you medication for your watery eyes and congestion-this includes Flonase for the nose Pataday drops for the eyes and a daily medication called Zyrtec which will help with any allergies.  Please follow-up with me in about 2 weeks.  I have put in an order for your mammogram and I will give you information on setting up an appointment for a colonoscopy.  I will also put in those 2 referrals to the rheumatologist and the gynecologist

## 2017-06-11 ENCOUNTER — Other Ambulatory Visit: Payer: Self-pay | Admitting: Internal Medicine

## 2017-06-11 DIAGNOSIS — Z1231 Encounter for screening mammogram for malignant neoplasm of breast: Secondary | ICD-10-CM

## 2017-06-11 NOTE — Assessment & Plan Note (Addendum)
Symptoms of allergic rhinitis.  Treat as follows - Olopatadine HCl (PATADAY) 0.2 % SOLN; Apply 1 drop to eye 2 (two) times daily.  Dispense: 1 Bottle; Refill: 0 - fluticasone (FLONASE) 50 MCG/ACT nasal spray; Place 2 sprays into both nostrils daily.  Dispense: 16 g; Refill: 6 - cetirizine (ZYRTEC) 10 MG tablet; Take 1 tablet (10 mg total) by mouth daily.  Dispense: 30 tablet; Refill: 5 -Follow up in two weeks

## 2017-06-11 NOTE — Assessment & Plan Note (Signed)
Since starting Protonix atypical chest pain has resolved.  She patient likely with reflux.  Is going to stop taking this medication as she thinks it makes her itchy.  Advised her that if the symptoms she was having come back to either restarted or call me to discuss another option

## 2017-06-11 NOTE — Assessment & Plan Note (Addendum)
Needs a screening mammogram and colonoscopy Patient is concerned about an allergic reaction she had to some medications at outside hospitals and states she wants to get this information before going for colonoscopy Referral placed for patient to see her OB/GYN Dr. Dareen PianoAnderson for Pap smear

## 2017-06-11 NOTE — Assessment & Plan Note (Signed)
Abdominal pain has resolved with regular bowel movements and protocol of MiraLAX and Colace

## 2017-06-13 ENCOUNTER — Telehealth: Payer: Self-pay | Admitting: Internal Medicine

## 2017-06-13 NOTE — Telephone Encounter (Signed)
Pt called to let PCP know she wants to be referred to the gynecologist she used to go [facility] and their phone number is [phone]

## 2017-06-14 NOTE — Telephone Encounter (Signed)
Referral was placed. Patient should be able to call to make an appointment if she would like to or wait for our referral specialist to do it. Please let patient know. Thanks

## 2017-06-15 NOTE — Telephone Encounter (Signed)
Pt informed and should wanted us to make the appt time for her. [facility] Deseree Bruna PotterBlount, CMA

## 2017-06-20 ENCOUNTER — Telehealth: Payer: Self-pay | Admitting: Internal Medicine

## 2017-06-20 NOTE — Telephone Encounter (Signed)
Pt would like to see Dr Malva LimesMark Anderson as ob-gyn.  [facility].  [phone]. Please send another referral to him

## 2017-06-21 ENCOUNTER — Ambulatory Visit: Payer: Self-pay | Admitting: Family Medicine

## 2017-06-22 NOTE — Telephone Encounter (Signed)
Patient has a referral in to Dr. Malva LimesMark Anderson in the referrals tab which has been approved. I am not sure why I would place another referral.

## 2017-06-28 ENCOUNTER — Other Ambulatory Visit: Payer: Self-pay

## 2017-06-28 ENCOUNTER — Ambulatory Visit: Payer: Medicare Other | Admitting: Internal Medicine

## 2017-06-28 VITALS — BP 101/60 | HR 59 | Temp 98.3°F | Wt 121.2 lb

## 2017-06-28 DIAGNOSIS — F411 Generalized anxiety disorder: Secondary | ICD-10-CM

## 2017-06-28 DIAGNOSIS — K219 Gastro-esophageal reflux disease without esophagitis: Secondary | ICD-10-CM | POA: Diagnosis not present

## 2017-06-28 DIAGNOSIS — R300 Dysuria: Secondary | ICD-10-CM

## 2017-06-28 LAB — POCT URINALYSIS DIP (MANUAL ENTRY)
Bilirubin, UA: NEGATIVE
Blood, UA: NEGATIVE
GLUCOSE UA: NEGATIVE mg/dL
Ketones, POC UA: NEGATIVE mg/dL
Leukocytes, UA: NEGATIVE
Nitrite, UA: NEGATIVE
Protein Ur, POC: NEGATIVE mg/dL
SPEC GRAV UA: 1.015 (ref 1.010–1.025)
UROBILINOGEN UA: 0.2 U/dL
pH, UA: 8 (ref 5.0–8.0)

## 2017-06-28 NOTE — Progress Notes (Signed)
   [facility] Noralee CharsAsiyah Jevon Littlepage, MD Phone: [phone]  Reason For Visit:   DYSURIA Pain urinating started about 1 week ago.  Pain is: when patient urinates at times, feels a very slight pain with urination  Medications tried: none  Any antibiotics in the last 30 days: none  More than 3 UTIs in the last 12 months: none   Symptoms Urgency: no Frequency: no Blood in urine: none  Pain in back: sometimes has a warm sensation in her lower back  Fever: none  Vaginal discharge: none  No nausea of vomiting   Panic Attacks  -Patient states over the past year she has been having panic attacks this specifically when she goes to the doctor also sometimes when she is worried about the weather such as [place].  She states that she feels tingling in her arms and legs she is having palpitations.  She would like to discuss this more at her next visit  GERD -Patient states she was taking the Protonix however it made her feel bad.  She is going to stop taking this medication.   Past Medical History Reviewed problem list.  Medications- reviewed and updated No additions to family history Social history- patient is a non-smoker  Objective: BP 101/60 (BP Location: Right Arm, Patient Position: Sitting, Cuff Size: Normal)   Pulse (!) 59   Temp 98.3 F (36.8 C) (Oral)   Wt 121 lb 3.2 oz (55 kg)   SpO2 98%   BMI 22.90 kg/m  Gen: NAD, alert, cooperative with exam Cardio: regular rate and rhythm, S1S2 heard, no murmurs appreciated Pulm: clear to auscultation bilaterally, no wheezes, rhonchi or rales GI: soft, non-tender, non-distended, bowel sounds present, no hepatomegaly, no splenomegaly Skin: dry, intact, no rashes or lesions  Assessment/Plan: See problem based a/p  Dysuria Complaining of slight pain with urination.  No suprapubic pubic pelvic pain.  No fevers chills.  No vaginal discharge or vaginal irritation.  No STDs. UA within normal limits.  It is possible  that the symptoms are due to vulvovaginal atrophy vs interstitial cystitis.  - Obtain urine culture  -Discussed with patient there is no resolution of symptoms could consider urology versus estrogen cream no infectious findings and patient still with symptoms  GERD (gastroesophageal reflux disease) Previously patient placed on PPI for generalized abdominal pain.  States that this pain improved GERD and constipation treatment.  States that PPI caused her bad -Will stop PPI -If patient develops reflux symptoms consider PEPCID  Anxiety state Indicates having panic attacks over the past year Would like to follow-up and discuss this further at next visit in two weeks

## 2017-06-28 NOTE — Patient Instructions (Addendum)
It was nice seeing you today. I am going to check your urine for signs of infection today.  If there are any signs of infection and will give you call and start you on antibiotics.  If there are not I would consider just increasing your fluids as this may be the cause of some of the small burning sensation you feel at times. please make sure to follow up for your mammogram and colonoscopy.  Please let me know if you need me to do your past.  Be happy to do it.  Please follow-up with me in 2 weeks to discuss your panic attacks

## 2017-06-30 LAB — URINE CULTURE

## 2017-07-03 ENCOUNTER — Encounter: Payer: Self-pay | Admitting: Internal Medicine

## 2017-07-03 DIAGNOSIS — K219 Gastro-esophageal reflux disease without esophagitis: Secondary | ICD-10-CM | POA: Insufficient documentation

## 2017-07-03 DIAGNOSIS — R300 Dysuria: Secondary | ICD-10-CM | POA: Insufficient documentation

## 2017-07-03 NOTE — Assessment & Plan Note (Signed)
Previously patient placed on PPI for generalized abdominal pain.  States that this pain improved GERD and constipation treatment.  States that PPI caused her bad -Will stop PPI -If patient develops reflux symptoms consider PEPCID

## 2017-07-03 NOTE — Assessment & Plan Note (Addendum)
Indicates having panic attacks over the past year Would like to follow-up and discuss this further at next visit in two weeks

## 2017-07-03 NOTE — Assessment & Plan Note (Signed)
Complaining of slight pain with urination.  No suprapubic pubic pelvic pain.  No fevers chills.  No vaginal discharge or vaginal irritation.  No STDs. UA within normal limits.  It is possible that the symptoms are due to vulvovaginal atrophy vs interstitial cystitis.  - Obtain urine culture  -Discussed with patient there is no resolution of symptoms could consider urology versus estrogen cream no infectious findings and patient still with symptoms

## 2017-07-13 ENCOUNTER — Ambulatory Visit
Admission: RE | Admit: 2017-07-13 | Discharge: 2017-07-13 | Disposition: A | Discharge status: Home / Self Care | Payer: Medicaid Other | Source: Ambulatory Visit | Attending: Family Medicine | Admitting: Family Medicine

## 2017-07-13 DIAGNOSIS — Z1231 Encounter for screening mammogram for malignant neoplasm of breast: Secondary | ICD-10-CM

## 2017-07-18 ENCOUNTER — Ambulatory Visit: Payer: Medicare Other | Admitting: Internal Medicine

## 2017-07-18 ENCOUNTER — Encounter: Payer: Self-pay | Admitting: Licensed Clinical Social Worker

## 2017-07-18 ENCOUNTER — Encounter: Payer: Self-pay | Admitting: Internal Medicine

## 2017-07-18 VITALS — BP 121/60 | HR 58 | Temp 98.4°F | Ht 62.0 in | Wt 122.8 lb

## 2017-07-18 DIAGNOSIS — Z23 Encounter for immunization: Secondary | ICD-10-CM | POA: Diagnosis not present

## 2017-07-18 DIAGNOSIS — F411 Generalized anxiety disorder: Secondary | ICD-10-CM

## 2017-07-18 DIAGNOSIS — F410 Panic disorder [episodic paroxysmal anxiety] without agoraphobia: Secondary | ICD-10-CM

## 2017-07-18 MED ORDER — HYDROXYZINE HCL 10 MG PO TABS: 10.0000 mg | ORAL_TABLET | Freq: Three times a day (TID) | ORAL | 0 refills | Status: DC | PRN

## 2017-07-18 NOTE — Progress Notes (Signed)
ESTIMATE TIME:30 minutes Type of Service: Integrated Behavioral Health warm handoff  Interpreter:Yes.    Spanish Helmut Muster( Alicia # [phone])  SUBJECTIVE: Cheryl White is a 57 y.o. female referred by Dr. Cathlean CowerMikell for: symptoms of  anxiety and panic attacks.   Patient reports symptoms worsen when she goes to the Dr. Dorathy KinsmanGets upset, during storms, and when she is excited.  It becomes difficult for her to breath. Patient also reports overall concerns about her health.  Duration of problem: started about 1 year ago  LIFE CONTEXT:  Family & Social:patient lives alone ,[place] and [address] [place], also has a church family  Life changes: none reported  GOALS: Patient will reduce symptoms of: anxiety, and increase  ability UJ:[id number]:coping skills and self-management skills, . INTERVENTION:  Behavioral Therapy (Relaxed breathing), Referral to Group Counseling     GAD-7=8,indication of : moderate anxiety.   ISSUES DISCUSSED: Integrated care services, support system, previous and current coping skills, community resources , things patient enjoy or use to enjoy doing, and demonstration of relaxed breathing.    ASSESSMENT:Patient currently experiencing symptoms of anxiety and panic attacks. Patient may benefit from, and is in agreement to receive further assessment and brief therapeutic interventions to assist with managing her symptoms. Patient believes she would benefit by participating in group therapy and would like a Spanish speaking group if possible.  She also would like to be connect to resources [place] [facility] [place] to living in [place].  PLAN:   1. LCSW will F/U with phone call in one week to see if patient connected to resources [phone]  2. Behavioral recommendations: relaxed breathing  3. Referral Group Counseling, at [facility]   Warm Hand Off Completed.     Sammuel Hineseborah Taggart Prasad, LCSW Licensed Clinical Social Worker [facility]   [phone] 3:33 PM

## 2017-07-18 NOTE — Progress Notes (Signed)
   [facility] Noralee CharsAsiyah Mikell, MD Phone: [phone]  Reason For Visit: Panic Attack   #Anxiety/Panic Attacks  Symptoms: Patient states that symptoms of panic attack anxiety started about 1 year ago. Patient feels like she has received medicine that she was allergic too.  After that she has had significant panic attacks every time she receives medication in the ED.  She feels like she is going to have an allergic reaction to medications.   Patient states she sometimes when she wakes up in the morning she feels nervous or shaky. Patient worries about her health.  She does worry about having panic attacks.  She feels worried about having panic attacks when she is in close spaces such as elevators  Patient states with the dentist she has panic attack  Panic attack disorder  Age of onset of first mood disturbance: Duration of CURRENT symptoms: Impact on function:  Psychiatric History - Diagnoses: No previous diagnosis  Family history of psychiatric issues: No significant family history Current and history of substance use: None Medical conditions that might explain or contribute to symptoms:  PHQ-9: 3 GAD7: 10, moderate anxiety  Past Medical History Reviewed problem list.  Medications- reviewed and updated No additions to family history Social history- patient is a non smoker  Objective: BP 121/60 (BP Location: Left Arm, Patient Position: Sitting, Cuff Size: Normal)   Pulse (!) 58   Temp 98.4 F (36.9 C) (Oral)   Ht 5\' 2"  (1.575 m)   Wt 122 lb 12.8 oz (55.7 kg)   SpO2 99%   BMI 22.46 kg/m  Gen: NAD, alert, cooperative with exam Extremities: warm, well perfused, No edema, cyanosis or clubbing;  MSK: Normal gait and station Skin: dry, intact, no rashes or lesions Neuro: Strength and sensation grossly intact   Assessment/Plan: See problem based a/p  Panic attacks Patient with a combination of panic attacks and anxiety.  Would prefer not to take any  medication consistently.  Would like to have behavioral techniques to help with her panic attacks. - will provide patient with hydroxyzine as needed for anxiety -Referred to behavioral medicine for techniques to help with anxiety -Follow-up in 1 month

## 2017-07-18 NOTE — Patient Instructions (Addendum)
Please follow up in 1 month. I am going to prescribe you Hydroxyzine, you can use this if you are having an anxiety attack

## 2017-07-19 LAB — TSH: TSH: 0.953 u[IU]/mL (ref 0.450–4.500)

## 2017-07-20 ENCOUNTER — Telehealth: Payer: Self-pay | Admitting: Licensed Clinical Social Worker

## 2017-07-20 NOTE — Progress Notes (Signed)
Service : Integrated Behavioral Health F/U Call   F/U call to patient [employer] [phone] Charlcie CradleRaul [phone])  reference interventions discussed and resources provided during warm handoff from PCP.  Patient informed LCSW to leave a message if she did not answer the phone.    Left voice message to see if patient connected to resources provided and to call LCSW if needed.  Sammuel Hineseborah Jafari Mckillop, LCSW Licensed Clinical Social Worker [facility]   [phone] 2:08 PM

## 2017-07-23 NOTE — Assessment & Plan Note (Addendum)
Patient with a combination of panic attacks and anxiety.  Would prefer not to take any medication consistently.  Would like to have behavioral techniques to help with her panic attacks. -Obtain TSH to rule out any thyroid issues - will provide patient with hydroxyzine as needed for anxiety -Referred to behavioral medicine for techniques to help with anxiety -Follow-up in 1 month

## 2017-08-03 ENCOUNTER — Encounter: Payer: Self-pay | Admitting: Internal Medicine

## 2017-08-21 ENCOUNTER — Encounter: Payer: Self-pay | Admitting: Internal Medicine

## 2017-08-21 ENCOUNTER — Ambulatory Visit: Payer: Medicare Other | Admitting: Internal Medicine

## 2017-08-21 DIAGNOSIS — F411 Generalized anxiety disorder: Secondary | ICD-10-CM | POA: Diagnosis not present

## 2017-08-21 DIAGNOSIS — H61899 Other specified disorders of external ear, unspecified ear: Secondary | ICD-10-CM | POA: Insufficient documentation

## 2017-08-21 DIAGNOSIS — Q159 Congenital malformation of eye, unspecified: Secondary | ICD-10-CM | POA: Diagnosis not present

## 2017-08-21 DIAGNOSIS — H61892 Other specified disorders of left external ear: Secondary | ICD-10-CM | POA: Diagnosis not present

## 2017-08-21 MED ORDER — HYDROCORTISONE-ACETIC ACID 1-2 % OT SOLN: 3.0000 [drp] | Freq: Two times a day (BID) | OTIC | 0 refills | Status: DC

## 2017-08-21 NOTE — Progress Notes (Signed)
   [facility] Noralee CharsAsiyah Durrel Mcnee, MD Phone: [phone]  Reason For Visit: Follow up   # Itchy left ear  -Patient states that she has been having an itchy ear for 2 days.  She denies any significant pain.  She denies any hearing changes.  She states that sometimes she feels like it moves down slightly into her neck. -No fevers or chills.  # Anxiety  Patient saw me about 1 month ago for anxiety and panic attacks States that she has been doing well since talking with me.  She denies having any issues with panic attacks or anxiety. She has not needed to take the hydroxyzine She would like to follow-up with Gavin Poundeborah to discuss breathing exercises more Gad 7 is 1, PHQ 9 is 0  # Eye  Concern about her eye.  States that last week she had a small white thing on her eyelid and was able to peel it off.  She is worried that there is another one coming.  Denies any specific pain associated with it or other issues  Past Medical History Reviewed problem list.  Medications- reviewed and updated No additions to family history Social history- patient is a non smoker  Objective: BP 120/60 (BP Location: Right Arm, Patient Position: Sitting, Cuff Size: Normal)   Pulse 67   Temp 97.8 F (36.6 C) (Oral)   Ht 5\' 2"  (1.575 m)   Wt 125 lb (56.7 kg)   SpO2 100%   BMI 22.86 kg/m  Gen: NAD, alert, cooperative with exam HEENT: Normal    Neck: No masses palpated. No lymphadenopathy    Ears: Dry otitis externa tympanic membranes intact, normal light reflex, no erythema, no bulging    Eyes: Normal conjunctiva, no lid abnormalities, no swelling, PERRLA, EOMI    Nose: nasal turbinates moist    Throat: moist mucus membranes, no erythema Cardio: regular rate and rhythm, S1S2 heard, no murmurs appreciated Pulm: clear to auscultation bilaterally, no wheezes, rhonchi or rales   Assessment/Plan: See problem based a/p  Ear canal dryness No signs of infection on examination Will provide  patient with some eardrops to help soothe your dryness in left ear    Anxiety state Improved, as any panic attacks or anxiety.  Gad 7 of 1.  Really enjoyed talking with with Ms. Christell ConstantMoore -Patient to follow-up and make an appointment with her for further counseling  Eye abnormality Possibly patient had a stye last week which has completely resolved, though unsure of what happened.  Currently she has no abnormalities with her eyes. Follow-up with me as needed

## 2017-08-21 NOTE — Patient Instructions (Signed)
I have provided you with a eardrop to use in your ears for the next week.  You can use this in your left ear to help with irritation and itchiness use 3 drops in your morning 5 days.  I do not see any issues with your eye right now.  Happy overall you are doing well.  Please follow-up in 1-2 months.

## 2017-08-21 NOTE — Assessment & Plan Note (Addendum)
Possibly patient had a stye last week which has completely resolved, though unsure of what happened.  Currently she has no abnormalities with her eyes. Follow-up with me as needed

## 2017-08-21 NOTE — Assessment & Plan Note (Signed)
No signs of infection on examination Will provide patient with some eardrops to help soothe your dryness in left ear

## 2017-08-21 NOTE — Assessment & Plan Note (Signed)
Improved, as any panic attacks or anxiety.  Gad 7 of 1.  Really enjoyed talking with with Ms. Christell ConstantMoore -Patient to follow-up and make an appointment with her for further counseling

## 2017-09-20 ENCOUNTER — Encounter: Payer: Self-pay | Admitting: Internal Medicine

## 2017-09-20 ENCOUNTER — Ambulatory Visit: Payer: Self-pay

## 2017-09-20 ENCOUNTER — Ambulatory Visit (INDEPENDENT_AMBULATORY_CARE_PROVIDER_SITE_OTHER): Payer: Medicare Other | Admitting: Internal Medicine

## 2017-09-20 VITALS — BP 118/60 | HR 56 | Temp 97.9°F | Wt 120.6 lb

## 2017-09-20 DIAGNOSIS — B369 Superficial mycosis, unspecified: Secondary | ICD-10-CM | POA: Diagnosis not present

## 2017-09-20 DIAGNOSIS — J309 Allergic rhinitis, unspecified: Secondary | ICD-10-CM

## 2017-09-20 DIAGNOSIS — H6240 Otitis externa in other diseases classified elsewhere, unspecified ear: Secondary | ICD-10-CM | POA: Diagnosis not present

## 2017-09-20 DIAGNOSIS — H9313 Tinnitus, bilateral: Secondary | ICD-10-CM | POA: Insufficient documentation

## 2017-09-20 MED ORDER — LORATADINE 10 MG PO TABS: 10.0000 mg | ORAL_TABLET | Freq: Every day | ORAL | 11 refills | Status: DC

## 2017-09-20 MED ORDER — CLOTRIMAZOLE 1 % EX SOLN: 1.0000 "application " | Freq: Every day | CUTANEOUS | 0 refills | Status: DC

## 2017-09-20 NOTE — Assessment & Plan Note (Addendum)
Possible that ichy ear due to allergies.  Patient is currently on Flonase, did not like Zyrtec. Denies any significant congestion.Will try another antihistamine - loratadine (CLARITIN) 10 MG tablet; Take 1 tablet (10 mg total) by mouth daily.  Dispense: 30 tablet; Refill: 11

## 2017-09-20 NOTE — Assessment & Plan Note (Addendum)
Given patient has been having itchy ears for about 2 months-the pills for dry ears has not helped.  It is possible that this is either allergies versus  Otomycosis.  -  Will have patient try treatment for both  - clotrimazole (LOTRIMIN) 1 % external solution; Apply 1 application topically daily. 1 drop in left ear  Dispense: 30 mL; Refill: 0 -  Take 10 days

## 2017-09-20 NOTE — Assessment & Plan Note (Signed)
Bilateral Tinnitus without red flags -provided reassurance -Handout on methods to help with tinnitus

## 2017-09-20 NOTE — Progress Notes (Signed)
   [facility] Noralee CharsAsiyah [name], MD Phone: [phone]  Reason For Visit: Follow up   # Itchy ears.  Patient continues to have an itchy left ear -2-3 months.  States that the itching kind of goes down into her throat.  The drops for dry ears that she was prescribed did not help.  She has been treated for allergic rhinitis in the past.  She is currently taking Flonase daily for nasal congestion.  However she has not been taking Zyrtec because felt like she had side effects from it.  #Tinnitus   Patient has ringing in her ears. Patient has ringing in both ears. Patient denies the ringing in the ears is not pustaile. She noticed it when there is no noise. Patient indicates itching in the left ear. Patient indicates that it associated with the itching. No fevers or chills.  No severe headaches. No lack of balance. No hearing loss. Has not been using NSAIDs. drinking caffiene or EtOH    Past Medical History Reviewed problem list.  Medications- reviewed and updated No additions to family history Social history- patient is a non smoker  Objective: BP 118/60 (BP Location: Right Arm, Patient Position: Sitting, Cuff Size: Normal)   Pulse (!) 56   Temp 97.9 F (36.6 C) (Oral)   Wt 120 lb 9.6 oz (54.7 kg)   SpO2 99%   BMI 22.06 kg/m  Gen: NAD, alert, cooperative with exam HEENT: Normal    Neck: No masses palpated. 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  It is possible that this is either  allergies versus  Otomycosis.  -  Will have patient try treatment for both  - clotrimazole (LOTRIMIN) 1 % external solution; Apply 1 application topically daily. 1 drop in left ear  Dispense: 30 mL; Refill: 0 -  Take 10 days    Allergic rhinitis Possible that ichy ear due to allergies.  Patient is currently on Flonase, did not like Zyrtec. Denies any significant congestion.Will try another antihistamine - loratadine (CLARITIN) 10 MG tablet; Take 1 tablet (10 mg total) by mouth daily.  Dispense: 30 tablet; Refill: 11

## 2017-09-20 NOTE — Patient Instructions (Addendum)
Please start taking Claritin. Use the 1 drop in the ear for 10 days.   Tinnitus (Tinnitus) El trmino tinnitus hace referencia a la percepcin de un sonido que no se corresponde con ninguna fuente real para ese sonido. A menudo se lo describe como zumbido de odos. Sin embargo, las personas que sufren esta afeccin pueden percibir diferentes ruidos. Una persona puede percibir el sonido en uno o en ambos odos. Los sonidos del tinnitus pueden ser Kinsman Centersuaves, fuertes o de intensidad intermedia. El tinnitus puede prolongarse pocos segundos o ser constante durante [address]varios das. Puede desaparecer sin tratamiento y regresar en distintos momentos. Cuando el tinnitus es permanente u ocurre con frecuencia, puede causar otros problemas, por ejemplo, dificultad para dormir y para concentrarse. Casi todas las Environmental health practitionerpersonas tienen tinnitus en algn momento. El tinnitus a Air cabin crewlargo plazo (crnico) o que regresa con frecuencia es un problema que puede requerir Psychologist, prison and probation servicesatencin mdica. CAUSAS A menudo se desconoce la causa del tinnitus. En algunos casos, puede ser Terex Corporationel resultado de otros problemas u otras afecciones, entre ellas:  Exposicin a ruidos fuertes de Bergermaquinarias, Knightdalemsica u otras fuentes.  Prdida auditiva.  Infecciones de los odos o de los senos paranasales.  Acumulacin de cerumen.  Un objeto extrao en el odo.  Uso de ciertos medicamentos.  Consumo de alcohol y cafena.  Hipertensin arterial.  Cardiopatas.  Anemia.  Alergias.  Enfermedad de Meniere.  Problemas de tiroides.  Tumores.  Dilatacin de una porcin de un vaso sanguneo debilitado (aneurisma). SNTOMAS El principal sntoma de tinnitus es la percepcin de un sonido que no se corresponde con ninguna fuente, no proviene de Dealerningn lugar. El sonido puede percibirse como lo siguiente:  Timbre.  Crepitacin.  Zumbido.  El soplido del aire, similar al sonido que se percibe en una  caracola.  Sibilancia.  Silbido.  Chisporroteo.  Runrn.  Una corriente de agua.  Una nota musical sostenida. DIAGNSTICO El diagnstico de tinnitus se basa en los sntomas. El Office Depotmdico le har un examen fsico. Se realizar un examen auditivo exhaustivo (audiometra) si el tinnitus:  Afecta un solo odo (unilateral).  Causa dificultades [id number].  Dura ms de 6meses. Adems, tal vez deba consultar a un mdico especialista en trastornos auditivos (audilogo). Pueden pedirle que responda un cuestionario para determinar la gravedad del tinnitus que padece. Se pueden hacer estudios para ayudar a Production assistant, radiodeterminar la causa y Sales promotion account executivedescartar otras enfermedades. Estos pueden incluir los siguientes:  Estudios de diagnstico por imgenes de la cabeza y el cerebro, por ejemplo: ? Tomografa computarizada. ? Resonancia magntica.  Un estudio de diagnstico por imgenes de los vasos sanguneos (angiografa). TRATAMIENTO A veces, el tratamiento de una enfermedad preexistente hace que el tinnitus desaparezca. Si el tinnitus contina, probablemente debe realizar American Electric Poweralguno de los siguientes tratamientos, Chiloquinentre otros:  Medicamentos, como determinados antidepresivos o pastillas para dormir.  Generadores de sonido para enmascarar el tinnitus. Estos incluyen los siguientes: ? Aparatos de sonido de mesa que reproducen sonidos relajantes para ayudarlo a dormir. ? Dispositivos inteligentes que se adaptan al odo y reproducen sonidos o Turkeymsica. ? Un pequeo dispositivo que Botswanausa auriculares para emitir una seal con msica (estimulacin Designer, industrial/productacstica neuronal). Con el tiempo, esto puede modificar las redes del cerebro y reducir la sensibilidad al tinnitus. Este dispositivo se Botswanausa en los casos muy graves cuando ningn otro tratamiento resulta eficaz.  Terapia y orientacin para ayudarlo a controlar el estrs que significa vivir con tinnitus.  El uso de audfonos o implantes cocleares, si el tinnitus guarda relacin con la  prdida  de la audicin. INSTRUCCIONES PARA EL CUIDADO EN EL HOGAR  Cuando sea posible, no permanezca en lugares ruidosos y no se exponga a sonidos fuertes.  Use dispositivos de proteccin de la audicin, por ejemplo, tapones, cuando est expuesto a ruidos fuertes.  No consuma sustancias estimulantes, como nicotina, alcohol o cafena.  Ponga en prctica tcnicas para reducir el estrs, como meditacin, yoga o respiracin profunda.  Use un aparato de sonido de fondo, un humidificador u otros dispositivos para enmascarar el sonido del tinnitus.  Duerma con la cabeza levemente elevada. Esto puede reducir el impacto del tinnitus.  Intente descansar lo suficiente todas las noches. SOLICITE ATENCIN MDICA SI:  Tiene tinnitus en un solo odo.  El tinnitus se prolonga durante 3semanas o ms tiempo y no se detiene.  Las medidas de cuidados en el hogar no Comptroller.  Tiene tinnitus despus de sufrir una lesin en la cabeza.  Tiene tinnitus junto con alguno de estos sntomas: ? Mareos. ? Prdida del equilibrio. ? Nuseas y vmitos. Esta informacin no tiene Theme park manager el consejo del mdico. Asegrese de hacerle al mdico cualquier pregunta que tenga. Document Released: 06/26/2005 Document Revised: 07/17/2014 Document Reviewed: 11/26/2013 Elsevier Interactive Patient Education  2018 [employer].

## 2017-10-09 ENCOUNTER — Ambulatory Visit: Payer: Self-pay | Admitting: Internal Medicine

## 2017-10-23 ENCOUNTER — Ambulatory Visit (INDEPENDENT_AMBULATORY_CARE_PROVIDER_SITE_OTHER): Payer: Medicare Other | Admitting: Internal Medicine

## 2017-10-23 ENCOUNTER — Encounter: Payer: Self-pay | Admitting: Internal Medicine

## 2017-10-23 VITALS — BP 120/60 | HR 61 | Temp 98.4°F | Ht 62.0 in | Wt 120.4 lb

## 2017-10-23 DIAGNOSIS — R05 Cough: Secondary | ICD-10-CM

## 2017-10-23 DIAGNOSIS — R059 Cough, unspecified: Secondary | ICD-10-CM

## 2017-10-23 MED ORDER — GUAIFENESIN-DM 100-10 MG/5ML PO SYRP: 5.0000 mL | ORAL_SOLUTION | ORAL | 0 refills | Status: DC | PRN

## 2017-10-23 NOTE — Progress Notes (Signed)
   [facility] Noralee CharsAsiyah Fredrick Dray, MD Phone: [phone]  Reason For Visit: SDA for Allergies   # Cough and Congestion   Patient has been having symptoms for the past week. She states she has cough. Patient has been having the phelgm in the morning and afternoon.  Patient states that the phlegm is white to off yellow.  She states that her eyes have been itchy at times as well.  She has been using her Flonase as needed for nasal congestion but has not used Claritin.  She states that she started Claritin yesterday but wanted to come in and talk to me to discuss whether she needs to continue taking this Nasal discharge: none  Medications tried: Flonase, 1 dose of Claritin yesterday Sick contacts: Denies any sick contacts  Symptoms Fever: None Headache or face pain: None Sneezing: Yes Scratchy throat: No Allergies: No  Past Medical History Reviewed problem list.  Medications- reviewed and updated No additions to family history Social history- patient is a non- smoker  Objective: BP 120/60 (BP Location: Right Arm, Patient Position: Sitting, Cuff Size: Normal)   Pulse 61   Temp 98.4 F (36.9 C) (Oral)   Ht 5\' 2"  (1.575 m)   Wt 120 lb 6.4 oz (54.6 kg)   SpO2 98%   BMI 22.02 kg/m  Gen: NAD, alert, cooperative with exam HEENT: Normal    Neck: No masses palpated. No lymphadenopathy    Ears: Tympanic membranes intact, normal light reflex, no erythema, no bulging    Eyes: PERRLA, EOMI    Nose: nasal turbinates congested    Throat: moist mucus membranes, no erythema-notable for some postnasal drainage Cardio: regular rate and rhythm, S1S2 heard, no murmurs appreciated Pulm: clear to auscultation bilaterally, no wheezes, rhonchi or rales Skin: dry, intact, no rashes or lesions  Assessment/Plan: See problem based a/p  Cough Cough likely due to allergies-patient does have some history of reflux in the past and is not currently taking her PPI.  Will treat for  allergies first and see if this cough improves -Start Claritin, continue Flonase -Follow-up in 2-3 weeks if no improvement

## 2017-10-23 NOTE — Patient Instructions (Addendum)
Please take Claritin, Flonase. I am going to prescribe Robitussons which should also help with cough and thin your mucous. Please follow up in 1 week if no improvement

## 2017-10-25 DIAGNOSIS — R05 Cough: Secondary | ICD-10-CM | POA: Insufficient documentation

## 2017-10-25 DIAGNOSIS — R059 Cough, unspecified: Secondary | ICD-10-CM | POA: Insufficient documentation

## 2017-10-25 NOTE — Assessment & Plan Note (Signed)
Cough likely due to allergies-patient does have some history of reflux in the past and is not currently taking her PPI.  Will treat for allergies first and see if this cough improves -Start Claritin, continue Flonase -Follow-up in 2-3 weeks if no improvement

## 2017-10-26 ENCOUNTER — Encounter (HOSPITAL_BASED_OUTPATIENT_CLINIC_OR_DEPARTMENT_OTHER): Payer: Self-pay | Admitting: *Deleted

## 2017-10-26 ENCOUNTER — Other Ambulatory Visit: Payer: Self-pay

## 2017-10-26 ENCOUNTER — Emergency Department (HOSPITAL_BASED_OUTPATIENT_CLINIC_OR_DEPARTMENT_OTHER)
Admission: EM | Admit: 2017-10-26 | Discharge: 2017-10-26 | Disposition: A | Discharge status: Home / Self Care | Payer: Medicare Other | Attending: Emergency Medicine | Admitting: Emergency Medicine

## 2017-10-26 DIAGNOSIS — K625 Hemorrhage of anus and rectum: Secondary | ICD-10-CM | POA: Insufficient documentation

## 2017-10-26 DIAGNOSIS — Z79899 Other long term (current) drug therapy: Secondary | ICD-10-CM | POA: Insufficient documentation

## 2017-10-26 DIAGNOSIS — I7300 Raynaud's syndrome without gangrene: Secondary | ICD-10-CM | POA: Insufficient documentation

## 2017-10-26 DIAGNOSIS — M3500 Sicca syndrome, unspecified: Secondary | ICD-10-CM | POA: Diagnosis not present

## 2017-10-26 DIAGNOSIS — F419 Anxiety disorder, unspecified: Secondary | ICD-10-CM | POA: Insufficient documentation

## 2017-10-26 LAB — CBC WITH DIFFERENTIAL/PLATELET
Basophils Absolute: 0 10*3/uL (ref 0.0–0.1)
Basophils Relative: 0 %
Eosinophils Absolute: 0 10*3/uL (ref 0.0–0.7)
Eosinophils Relative: 1 %
HCT: 40.5 % (ref 36.0–46.0)
Hemoglobin: 13.7 g/dL (ref 12.0–15.0)
Lymphocytes Relative: 21 %
Lymphs Abs: 0.8 10*3/uL (ref 0.7–4.0)
MCH: 27.6 pg (ref 26.0–34.0)
MCHC: 33.8 g/dL (ref 30.0–36.0)
MCV: 81.5 fL (ref 78.0–100.0)
Monocytes Absolute: 0.3 10*3/uL (ref 0.1–1.0)
Monocytes Relative: 7 %
Neutro Abs: 2.9 10*3/uL (ref 1.7–7.7)
Neutrophils Relative %: 71 %
Platelets: 177 10*3/uL (ref 150–400)
RBC: 4.97 MIL/uL (ref 3.87–5.11)
RDW: 13.2 % (ref 11.5–15.5)
WBC: 4.1 10*3/uL (ref 4.0–10.5)

## 2017-10-26 LAB — COMPREHENSIVE METABOLIC PANEL
ALT: 18 U/L (ref 14–54)
AST: 25 U/L (ref 15–41)
Albumin: 4.4 g/dL (ref 3.5–5.0)
Alkaline Phosphatase: 126 U/L (ref 38–126)
Anion gap: 8 (ref 5–15)
BUN: 9 mg/dL (ref 6–20)
CO2: 26 mmol/L (ref 22–32)
Calcium: 8.9 mg/dL (ref 8.9–10.3)
Chloride: 104 mmol/L (ref 101–111)
Creatinine, Ser: 0.77 mg/dL (ref 0.44–1.00)
GFR calc Af Amer: >60 mL/min (ref >60)
GFR calc non Af Amer: >60 mL/min (ref >60)
Glucose, Bld: 96 mg/dL (ref 65–99)
Potassium: 3.5 mmol/L (ref 3.5–5.1)
Sodium: 138 mmol/L (ref 135–145)
Total Bilirubin: 0.6 mg/dL (ref 0.3–1.2)
Total Protein: 7.4 g/dL (ref 6.5–8.1)

## 2017-10-26 LAB — OCCULT BLOOD X 1 CARD TO LAB, STOOL: Fecal Occult Bld: POSITIVE — AB

## 2017-10-26 NOTE — ED Provider Notes (Signed)
[facility] EMERGENCY DEPARTMENT Provider Note   CSN: [id number] Arrival date & time: 10/26/17  1126     History   Chief Complaint Chief Complaint  Patient presents with  . Rectal Bleeding    HPI Cheryl White is a 57 y.o. female with history of Sjogren's disease and Raynaud's disease who presents with a 1 day history of rectal bleeding.  Patient has noticed bright red blood per rectum for the past 2-3 bowel movements.  She has had some urge to defecate, however has had some difficulty.  She denies any abdominal pain, nausea, vomiting, diarrhea.  Patient reports having a colonoscopy 8 years ago.  She saw her PCP a few days ago and discussed having a colonoscopy soon.  She did not have the rectal bleeding at that time.  She denies any history of hemorrhoids.  She denies any pain in her chest, shortness of breath, urinary symptoms, abnormal vaginal bleeding or discharge, or fevers.  HPI  Past Medical History:  Diagnosis Date  . Raynaud's disease   . Sjogren's disease [facility](HCC)     Patient Active Problem List   Diagnosis Date Noted  . Cough 10/25/2017  . Tinnitus of both ears 09/20/2017  . 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 Ear canal dryness 08/21/2017  . Panic attacks 07/18/2017  . GERD (gastroesophageal reflux disease) 07/03/2017  . Allergic rhinitis 06/08/2017  . Encounter for screening mammogram for malignant neoplasm of breast 06/08/2017  . Atypical chest pain 05/25/2017  . Generalized abdominal pain 05/25/2017  . Lupus (systemic lupus erythematosus) (HCC) 05/09/2017  . Raynaud disease 05/09/2017  . Healthcare maintenance 05/09/2017  . Anxiety state 05/09/2017    Past Surgical History:  Procedure Laterality Date  . BREAST SURGERY    . CESAREAN SECTION    . TUBAL LIGATION       OB History   None      Home Medications    Prior to Admission medications   Medication Sig Start Date End Date Taking? Authorizing Provider  acetic acid-hydrocortisone (VOSOL-HC) OTIC  solution Place 3 drops into the left ear 2 (two) times daily. 08/21/17   Mikell, Antionette PolesAsiyah Zahra, MD  clotrimazole (LOTRIMIN) 1 % external solution Apply 1 application topically daily. 1 drop in left ear 09/20/17   Mikell, Antionette PolesAsiyah Zahra, MD  docusate sodium (COLACE) 100 MG capsule Take 1 capsule (100 mg total) 2 (two) times daily by mouth. 05/25/17   Mikell, Antionette PolesAsiyah Zahra, MD  fluticasone (FLONASE) 50 MCG/ACT nasal spray Place 2 sprays into both nostrils daily. 06/08/17   Mikell, Antionette PolesAsiyah Zahra, MD  guaiFENesin-dextromethorphan (ROBITUSSIN DM) 100-10 MG/5ML syrup Take 5 mLs by mouth every 4 (four) hours as needed for cough. 10/23/17   Mikell, Antionette PolesAsiyah Zahra, MD  hydrOXYzine (ATARAX/VISTARIL) 10 MG tablet Take 1 tablet (10 mg total) by mouth 3 (three) times daily as needed. 07/18/17   Mikell, Antionette PolesAsiyah Zahra, MD  loratadine (CLARITIN) 10 MG tablet Take 1 tablet (10 mg total) by mouth daily. 09/20/17   Mikell, Antionette PolesAsiyah Zahra, MD  Olopatadine HCl (PATADAY) 0.2 % SOLN Apply 1 drop to eye 2 (two) times daily. 06/08/17   Mikell, Antionette PolesAsiyah Zahra, MD  pantoprazole (PROTONIX) 40 MG tablet Take 1 tablet (40 mg total) daily by mouth. 05/25/17   Mikell, Antionette PolesAsiyah Zahra, MD  polyethylene glycol powder (GLYCOLAX/MIRALAX) powder Take 17 g 2 (two) times daily as needed by mouth. 05/25/17   Mikell, Antionette PolesAsiyah Zahra, MD    Family History Family History  Problem Relation Age of Onset  .  Diabetes Mother   . Diabetes Father   . Arthritis/Rheumatoid Father   . Diabetes Brother   . Heart attack Paternal Grandfather     Social History Social History   Tobacco Use  . Smoking status: Never Smoker  . Smokeless tobacco: Never Used  Substance Use Topics  . Alcohol use: No  . Drug use: No     Allergies   Lidocaine   Review of Systems Review of Systems  Constitutional: Negative for chills and fever.  HENT: Negative for facial swelling and sore throat.   Respiratory: Negative for shortness of breath.   Cardiovascular: Negative for  chest pain.  Gastrointestinal: Positive for blood in stool. Negative for abdominal pain, nausea and vomiting.  Genitourinary: Negative for dysuria.  Musculoskeletal: Negative for back pain.  Skin: Negative for rash and wound.  Neurological: Negative for headaches.  Psychiatric/Behavioral: The patient is not nervous/anxious.      Physical Exam Updated Vital Signs BP 137/88   Pulse 100   Temp 98.1 F (36.7 C) (Oral)   Resp 20   Ht 5\' 2"  (1.575 m)   Wt 54.4 kg (120 lb)   SpO2 100%   BMI 21.95 kg/m   Physical Exam  Constitutional: She appears well-developed and well-nourished. No distress.  HENT:  Head: Normocephalic and atraumatic.  Mouth/Throat: Oropharynx is clear and moist. No oropharyngeal exudate.  Eyes: Pupils are equal, round, and reactive to light. Conjunctivae are normal. Right eye exhibits no discharge. Left eye exhibits no discharge. No scleral icterus.  Neck: Normal range of motion. Neck supple. No thyromegaly present.  Cardiovascular: Normal rate, regular rhythm, normal heart sounds and intact distal pulses. Exam reveals no gallop and no friction rub.  No murmur heard. Pulmonary/Chest: Effort normal and breath sounds normal. No stridor. No respiratory distress. She has no wheezes. She has no rales.  Abdominal: Soft. Bowel sounds are normal. She exhibits no distension. There is no tenderness. There is no rebound and no guarding.  Genitourinary: Rectal exam shows no external hemorrhoid, no internal hemorrhoid, no fissure and no tenderness.  Genitourinary Comments: Bright red blood mixed with stool on DRE  Musculoskeletal: She exhibits no edema.  Lymphadenopathy:    She has no cervical adenopathy.  Neurological: She is alert. Coordination normal.  Skin: Skin is warm and dry. No rash noted. She is not diaphoretic. No pallor.  Psychiatric: She has a normal mood and affect.  Nursing note and vitals reviewed.    ED Treatments / Results  Labs (all labs ordered are  listed, but only abnormal results are displayed) Labs Reviewed  OCCULT BLOOD X 1 CARD TO LAB, STOOL - Abnormal; Notable for the following components:      Result Value   Fecal Occult Bld POSITIVE (*)    All other components within normal limits  COMPREHENSIVE METABOLIC PANEL  CBC WITH DIFFERENTIAL/PLATELET    EKG None  Radiology No results found.  Procedures Procedures (including critical care time)  Medications Ordered in ED Medications - No data to display   Initial Impression / Assessment and Plan / ED Course  I have reviewed the triage vital signs and the nursing notes.  Pertinent labs & imaging results that were available during my care of the patient were reviewed by me and considered in my medical decision making (see chart for details).     Patient with rectal bleeding for 1 day.  CBC, CMP unremarkable.  Fecal occult is positive.  No signs of hemorrhage.  No abdominal tenderness.  No indication for imaging today.  Refer to GI for further evaluation and colonoscopy.  PCP in process of setting this up already.  Return precautions discussed.  Patient understands and agrees with plan.  Patient vitals stable throughout ED course and discharged in satisfactory condition. I discussed patient case with Dr. Rush Landmark who agrees with plan.   Final Clinical Impressions(s) / ED Diagnoses   Final diagnoses:  Rectal bleeding    ED Discharge Orders    None       Emi Holes, PA-C 10/26/17 1511    Tegeler, Canary Brim, MD 10/26/17 1547

## 2017-10-26 NOTE — Discharge Instructions (Signed)
Please see your doctor and/or the gastroenterologist for further evaluation and treatment of your rectal bleeding.  He will need a colonoscopy.  Please return to the emergency department if you develop any new or worsening symptoms including severe abdominal pain, significant increase in your bleeding, or any other new or concerning symptoms.

## 2017-10-26 NOTE — ED Triage Notes (Signed)
This am she had rectal pain. She pushed to have a BM and had bright red blood. Urge to defecate but unable.

## 2017-11-02 ENCOUNTER — Other Ambulatory Visit: Payer: Self-pay

## 2017-11-02 ENCOUNTER — Encounter: Payer: Self-pay | Admitting: Internal Medicine

## 2017-11-02 ENCOUNTER — Ambulatory Visit (INDEPENDENT_AMBULATORY_CARE_PROVIDER_SITE_OTHER): Payer: Medicare Other | Admitting: Internal Medicine

## 2017-11-02 VITALS — BP 96/58 | HR 77 | Temp 99.0°F | Ht 62.0 in | Wt 121.0 lb

## 2017-11-02 DIAGNOSIS — R05 Cough: Secondary | ICD-10-CM

## 2017-11-02 DIAGNOSIS — R413 Other amnesia: Secondary | ICD-10-CM | POA: Insufficient documentation

## 2017-11-02 DIAGNOSIS — K625 Hemorrhage of anus and rectum: Secondary | ICD-10-CM | POA: Diagnosis present

## 2017-11-02 DIAGNOSIS — R059 Cough, unspecified: Secondary | ICD-10-CM

## 2017-11-02 DIAGNOSIS — J309 Allergic rhinitis, unspecified: Secondary | ICD-10-CM

## 2017-11-02 MED ORDER — FLUTICASONE PROPIONATE 50 MCG/ACT NA SUSP: 2.0000 | Freq: Every day | NASAL | 6 refills | Status: DC

## 2017-11-02 MED ORDER — LORATADINE 10 MG PO TABS: 10.0000 mg | ORAL_TABLET | Freq: Every day | ORAL | 11 refills | Status: DC

## 2017-11-02 MED ORDER — GUAIFENESIN-DM 100-10 MG/5ML PO SYRP: 5.0000 mL | ORAL_SOLUTION | ORAL | 0 refills | Status: DC | PRN

## 2017-11-02 NOTE — Progress Notes (Signed)
   [facility] Noralee CharsAsiyah Kenedie Dirocco, MD Phone: [phone]  Reason For Visit: Follow up   #Cough/congestion -She is changing her pharmacy.  She never received her prescriptions at the current pharmacy.  She still having cough and congestion.  She would like her allergy medication   #Rectal bleeding -One episode of rectal bleeding patient had this several days ago and went to the ED for evaluation.  She denies any history of hemorrhoids internal or external.  She has not had any further episodes.  She denies any diarrhea or constipation.  [place]  She believes the Dr. Evette CristalGanem   # Memory Loss -She states she has been having ongoing memory loss for the past year and a half.  She has noticed it recently over the past 6 months.  She has been trying to study for her citizenship exam and has significant difficulty with remembering things.  She states she also cannot remember quotes at her church when she is doing Bible readings.  She states that previously she had no symptoms of memory loss like this.  She is concerned she is going to fail her exam for citizenship and would like to be further evaluated  Past Medical History Reviewed problem list.  Medications- reviewed and updated No additions to family history  Objective: BP (!) 96/58   Pulse 77   Temp 99 F (37.2 C) (Oral)   Ht 5\' 2"  (1.575 m)   Wt 121 lb (54.9 kg)   SpO2 98%   BMI 22.13 kg/m  Gen: NAD, alert, cooperative with exam HEENT: Normal    Neck: No masses palpated. No lymphadenopathy    Ears: Tympanic membranes intact, normal light reflex, no erythema, no bulging    Eyes: PERRLA, EOMI    Nose: nasal turbinates moist    Throat: moist mucus membranes, no erythema Cardio: regular rate and rhythm, S1S2 heard, no murmurs appreciated Pulm: clear to auscultation bilaterally, no wheezes, rhonchi or rales Neurologic exam : Cn 2-7 intact Strength equal & normal  in upper & lower extremities Balance normal  Romberg normal, finger to nose   Assessment/Plan: See problem based a/p  Allergic rhinitis Not yet improved  Unable to pick up medications  Will provide print offs of prescriptions   Rectal bleeding Unsure of patient's cause of rectal bleeding.  Denies any history of hemorrhoids.  Does have lupus which is not currently being treated - Ambulatory referral to Gastroenterology  Memory difficulty Indicates having memory difficulty.  Normal neurologic exam.  Would like to be referred to neurology for further work-up. - RPR - CBC - Basic Metabolic Panel - Folate - Vitamin B12 - Ambulatory referral to Neurology

## 2017-11-02 NOTE — Assessment & Plan Note (Signed)
Indicates having memory difficulty.  Normal neurologic exam.  Would like to be referred to neurology for further work-up. - RPR - CBC - Basic Metabolic Panel - Folate - Vitamin B12 - Ambulatory referral to Neurology

## 2017-11-02 NOTE — Assessment & Plan Note (Signed)
Not yet improved  Unable to pick up medications  Will provide print offs of prescriptions

## 2017-11-02 NOTE — Assessment & Plan Note (Signed)
Unsure of patient's cause of rectal bleeding.  Denies any history of hemorrhoids.  Does have lupus which is not currently being treated - Ambulatory referral to Gastroenterology

## 2017-11-02 NOTE — Patient Instructions (Signed)
I have prescribed your allergy medications you can take them to whatever [facility] you would like.  For your rectal bleeding you need to follow-up with GI as soon as possible.  For your memory loss we will do some initial lab work but I want you to follow-up with neurology.

## 2017-11-03 LAB — CBC
Hematocrit: 39.9 % (ref 34.0–46.6)
Hemoglobin: 13.5 g/dL (ref 11.1–15.9)
MCH: 26.9 pg (ref 26.6–33.0)
MCHC: 33.8 g/dL (ref 31.5–35.7)
MCV: 80 fL (ref 79–97)
PLATELETS: 211 10*3/uL (ref 150–379)
RBC: 5.01 x10E6/uL (ref 3.77–5.28)
RDW: 13.9 % (ref 12.3–15.4)
WBC: 4.4 10*3/uL (ref 3.4–10.8)

## 2017-11-03 LAB — BASIC METABOLIC PANEL
BUN/Creatinine Ratio: 17 (ref 9–23)
BUN: 12 mg/dL (ref 6–24)
CALCIUM: 9.5 mg/dL (ref 8.7–10.2)
CHLORIDE: 108 mmol/L — AB (ref 96–106)
CO2: 24 mmol/L (ref 20–29)
Creatinine, Ser: 0.7 mg/dL (ref 0.57–1.00)
GFR calc non Af Amer: 97 mL/min/{1.73_m2} (ref >59)
GFR, EST AFRICAN AMERICAN: 112 mL/min/{1.73_m2} (ref >59)
GLUCOSE: 93 mg/dL (ref 65–99)
POTASSIUM: 4.7 mmol/L (ref 3.5–5.2)
Sodium: 147 mmol/L — ABNORMAL HIGH (ref 134–144)

## 2017-11-03 LAB — RPR: RPR Ser Ql: NONREACTIVE

## 2017-11-03 LAB — FOLATE: FOLATE: 12.8 ng/mL (ref >3.0)

## 2017-11-03 LAB — VITAMIN B12: VITAMIN B 12: 563 pg/mL (ref 232–1245)

## 2017-11-12 ENCOUNTER — Encounter: Payer: Self-pay | Admitting: Internal Medicine

## 2019-04-24 ENCOUNTER — Emergency Department (HOSPITAL_BASED_OUTPATIENT_CLINIC_OR_DEPARTMENT_OTHER): Payer: Medicare Other

## 2019-04-24 ENCOUNTER — Other Ambulatory Visit: Payer: Self-pay

## 2019-04-24 ENCOUNTER — Emergency Department (HOSPITAL_BASED_OUTPATIENT_CLINIC_OR_DEPARTMENT_OTHER)
Admission: EM | Admit: 2019-04-24 | Discharge: 2019-04-24 | Disposition: A | Discharge status: Home / Self Care | Payer: Medicare Other | Attending: Emergency Medicine | Admitting: Emergency Medicine

## 2019-04-24 ENCOUNTER — Encounter (HOSPITAL_BASED_OUTPATIENT_CLINIC_OR_DEPARTMENT_OTHER): Payer: Self-pay | Admitting: *Deleted

## 2019-04-24 DIAGNOSIS — M5431 Sciatica, right side: Secondary | ICD-10-CM | POA: Diagnosis not present

## 2019-04-24 DIAGNOSIS — Z79899 Other long term (current) drug therapy: Secondary | ICD-10-CM | POA: Insufficient documentation

## 2019-04-24 DIAGNOSIS — M545 Low back pain: Secondary | ICD-10-CM | POA: Diagnosis present

## 2019-04-24 HISTORY — DX: Reserved for concepts with insufficient information to code with codable children: IMO0002

## 2019-04-24 HISTORY — DX: Systemic lupus erythematosus, unspecified: M32.9

## 2019-04-24 LAB — URINALYSIS, ROUTINE W REFLEX MICROSCOPIC
Bilirubin Urine: NEGATIVE
Glucose, UA: NEGATIVE mg/dL
Hgb urine dipstick: NEGATIVE
Ketones, ur: NEGATIVE mg/dL
Nitrite: NEGATIVE
Protein, ur: NEGATIVE mg/dL
Specific Gravity, Urine: 1.015 (ref 1.005–1.030)
pH: 7.5 (ref 5.0–8.0)

## 2019-04-24 LAB — URINALYSIS, MICROSCOPIC (REFLEX)

## 2019-04-24 MED ORDER — PREDNISONE 10 MG PO TABS
60.0000 mg | ORAL_TABLET | Freq: Once | ORAL | Status: AC
  Administered 2019-04-24: 23:00:00 60 mg via ORAL
  Filled 2019-04-24: qty 1

## 2019-04-24 MED ORDER — PREDNISONE 10 MG PO TABS: 40.0000 mg | ORAL_TABLET | Freq: Every day | ORAL | 0 refills | Status: DC

## 2019-04-24 NOTE — ED Notes (Signed)
Patient verbalizes understanding of discharge instructions. Opportunity for questioning and answers were provided. Armband removed by staff, pt discharged from ED.  

## 2019-04-24 NOTE — ED Provider Notes (Signed)
[facility] EMERGENCY DEPARTMENT Provider Note   CSN: [id number] Arrival date & time: 04/24/19  1934     History   Chief Complaint Chief Complaint  Patient presents with  . Hip Pain    HPI Cheryl White is a 58 y.o. female.     Patient with a history of lupus.  Complaining of some right lower back pain that radiates into the right buttocks and down to the calf posteriorly on the right side.  Denies any numbness or weakness to her foot.  But when she walks the leg will give out sometimes.  No injury or fall.  Patient currently not on prednisone.  No incontinence.     Past Medical History:  Diagnosis Date  . Lupus (HCC)   . Raynaud's disease   . Sjogren's disease [facility])     Patient Active Problem List   Diagnosis Date Noted  . Rectal bleeding 11/02/2017  . Memory difficulty 11/02/2017  . Cough 10/25/2017  . Tinnitus of both ears 09/20/2017  . 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 Ear canal dryness 08/21/2017  . Panic attacks 07/18/2017  . GERD (gastroesophageal reflux disease) 07/03/2017  . Allergic rhinitis 06/08/2017  . Encounter for screening mammogram for malignant neoplasm of breast 06/08/2017  . Atypical chest pain 05/25/2017  . Generalized abdominal pain 05/25/2017  . Lupus (systemic lupus erythematosus) (HCC) 05/09/2017  . Raynaud disease 05/09/2017  . Healthcare maintenance 05/09/2017  . Anxiety state 05/09/2017    Past Surgical History:  Procedure Laterality Date  . BREAST SURGERY    . CESAREAN SECTION    . TUBAL LIGATION       OB History   No obstetric history on file.      Home Medications    Prior to Admission medications   Medication Sig Start Date End Date Taking? Authorizing Provider  hydroxychloroquine (PLAQUENIL) 200 MG tablet Take by mouth daily.   Yes [provider]  acetic acid-hydrocortisone (VOSOL-HC) OTIC solution Place 3 drops into the left ear 2 (two) times daily. 08/21/17   Mikell, Antionette Poles, MD  clotrimazole  (LOTRIMIN) 1 % external solution Apply 1 application topically daily. 1 drop in left ear 09/20/17   Mikell, Antionette Poles, MD  docusate sodium (COLACE) 100 MG capsule Take 1 capsule (100 mg total) 2 (two) times daily by mouth. 05/25/17   Mikell, Antionette Poles, MD  fluticasone (FLONASE) 50 MCG/ACT nasal spray Place 2 sprays into both nostrils daily. 11/02/17   Mikell, Antionette Poles, MD  guaiFENesin-dextromethorphan (ROBITUSSIN DM) 100-10 MG/5ML syrup Take 5 mLs by mouth every 4 (four) hours as needed for cough. 11/02/17   Mikell, Antionette Poles, MD  hydrOXYzine (ATARAX/VISTARIL) 10 MG tablet Take 1 tablet (10 mg total) by mouth 3 (three) times daily as needed. 07/18/17   Mikell, Antionette Poles, MD  loratadine (CLARITIN) 10 MG tablet Take 1 tablet (10 mg total) by mouth daily. 11/02/17   Mikell, Antionette Poles, MD  Olopatadine HCl (PATADAY) 0.2 % SOLN Apply 1 drop to eye 2 (two) times daily. 06/08/17   Mikell, Antionette Poles, MD  pantoprazole (PROTONIX) 40 MG tablet Take 1 tablet (40 mg total) daily by mouth. 05/25/17   Mikell, Antionette Poles, MD  polyethylene glycol powder (GLYCOLAX/MIRALAX) powder Take 17 g 2 (two) times daily as needed by mouth. 05/25/17   Mikell, Antionette Poles, MD  predniSONE (DELTASONE) 10 MG tablet Take 4 tablets (40 mg total) by mouth daily. 04/24/19   Vanetta Mulders, MD    Family History Family  History  Problem Relation Age of Onset  . Diabetes Mother   . Diabetes Father   . Arthritis/Rheumatoid Father   . Diabetes Brother   . Heart attack Paternal Grandfather     Social History Social History   Tobacco Use  . Smoking status: Never Smoker  . Smokeless tobacco: Never Used  Substance Use Topics  . Alcohol use: No  . Drug use: No     Allergies   Lidocaine   Review of Systems Review of Systems  Constitutional: Negative for chills and fever.  HENT: Negative for congestion, rhinorrhea and sore throat.   Eyes: Negative for visual disturbance.  Respiratory: Negative for cough  and shortness of breath.   Cardiovascular: Negative for chest pain and leg swelling.  Gastrointestinal: Negative for abdominal pain, diarrhea, nausea and vomiting.  Genitourinary: Negative for difficulty urinating, dysuria and frequency.  Musculoskeletal: Positive for back pain. Negative for neck pain.  Skin: Negative for rash.  Neurological: Positive for weakness. Negative for dizziness, light-headedness, numbness and headaches.  Hematological: Does not bruise/bleed easily.  Psychiatric/Behavioral: Negative for confusion.     Physical Exam Updated Vital Signs BP 113/75   Pulse 60   Temp 98.6 F (37 C) (Oral)   Resp 16   Ht 1.549 m ( )   Wt 56.7 kg   SpO2 100%   BMI 23.62 kg/m   Physical Exam Vitals signs and nursing note reviewed.  Constitutional:      General: She is not in acute distress.    Appearance: Normal appearance. She is well-developed.  HENT:     Head: Normocephalic and atraumatic.  Eyes:     Extraocular Movements: Extraocular movements intact.     Conjunctiva/sclera: Conjunctivae normal.     Pupils: Pupils are equal, round, and reactive to light.  Neck:     Musculoskeletal: Normal range of motion and neck supple.  Cardiovascular:     Rate and Rhythm: Normal rate and regular rhythm.     Heart sounds: No murmur.  Pulmonary:     Effort: Pulmonary effort is normal. No respiratory distress.     Breath sounds: Normal breath sounds.  Abdominal:     Palpations: Abdomen is soft.     Tenderness: There is no abdominal tenderness.  Musculoskeletal: Normal range of motion.     Comments: No weakness to the right foot.  No sensory deficit.  Good cap refill.  Dorsalis pedis pulse 2+.  No tenderness to palpation to the lumbar back.  Skin:    General: Skin is warm and dry.     Capillary Refill: Capillary refill takes less than 2 seconds.  Neurological:     General: No focal deficit present.     Mental Status: She is alert and oriented to person, place, and time.      Cranial Nerves: No cranial nerve deficit.     Sensory: No sensory deficit.     Motor: No weakness.      ED Treatments / Results  Labs (all labs ordered are listed, but only abnormal results are displayed) Labs Reviewed  URINALYSIS, ROUTINE W REFLEX MICROSCOPIC - Abnormal; Notable for the following components:      Result Value   Leukocytes,Ua TRACE (*)    All other components within normal limits  URINALYSIS, MICROSCOPIC (REFLEX) - Abnormal; Notable for the following components:   Bacteria, UA RARE (*)    All other components within normal limits  PREGNANCY, URINE    EKG None  Radiology Dg Lumbar  Spine Complete  Result Date: 04/24/2019 CLINICAL DATA:  Low back pain with right-sided radiculopathy EXAM: LUMBAR SPINE - COMPLETE 4+ VIEW COMPARISON:  04/17/2016 FINDINGS: Five lumbar type vertebral segments. Vertebral body heights and alignment are maintained. No fracture identified. Intervertebral disc spaces are relatively preserved. Minimal degenerative endplate changes. Mild lower lumbar facet arthrosis. IMPRESSION: Mild degenerative changes of the lumbar spine without acute osseous abnormality. Electronically Signed   By: Davina Poke M.D.   On: 04/24/2019 21:56    Procedures Procedures (including critical care time)  Medications Ordered in ED Medications  predniSONE (DELTASONE) tablet 60 mg (has no administration in time range)     Initial Impression / Assessment and Plan / ED Course  I have reviewed the triage vital signs and the nursing notes.  Pertinent labs & imaging results that were available during my care of the patient were reviewed by me and considered in my medical decision making (see chart for details).       Patient symptoms consistent with right-sided sciatica.  Lumbar x-ray without any significant abnormalities.  Will treat with prednisone.  Patient currently not on prednisone have her follow-up with her primary care doctor.  Patient nontoxic no  acute distress.  No evidence of any cauda equina.   Final Clinical Impressions(s) / ED Diagnoses   Final diagnoses:  Sciatica of right side    ED Discharge Orders         Ordered    predniSONE (DELTASONE) 10 MG tablet  Daily     04/24/19 2250           Fredia Sorrow, MD 04/24/19 2255

## 2019-04-24 NOTE — Discharge Instructions (Signed)
Take the prednisone as directed.  Make an appointment to follow-up with your doctor.  X-rays of the back today here to without any acute abnormalities.  But your symptoms are consistent with some nerve irritation coming out of the right lower part of your back and those nerves go into your legs.

## 2019-04-24 NOTE — ED Notes (Signed)
Ambulated patient to bathroom with minimal assistance. Tolerated well.

## 2019-04-24 NOTE — ED Notes (Addendum)
Patient states she has right hip pain that started last Thursday, states she has the constant pain especially when she is walking. Also mentioned that her leg/knee seems to give out every now and then along with some tingling to her right extremities. Pain 10/10 at this time. No recent injuries or falls.

## 2019-04-24 NOTE — ED Triage Notes (Signed)
Right hip pain. Hx of lupus.

## 2021-05-30 IMAGING — CR DG LUMBAR SPINE COMPLETE 4+V
5 series · 5 of 5 positions shown · non-contrast
Comparison: 04/17/2016

CLINICAL DATA: Low back pain with right-sided radiculopathy

EXAM:
LUMBAR SPINE - COMPLETE 4+ VIEW

[t l-spine a.p.]
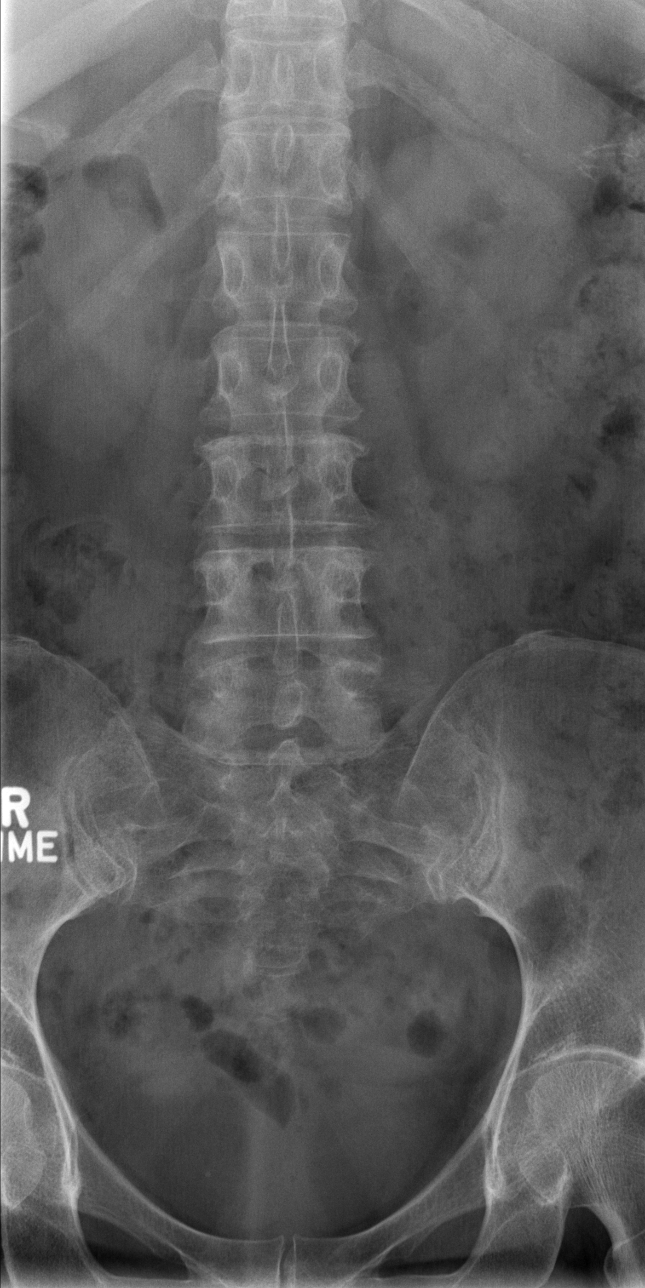

[t l-spine oblique exposure (1 of 2)]
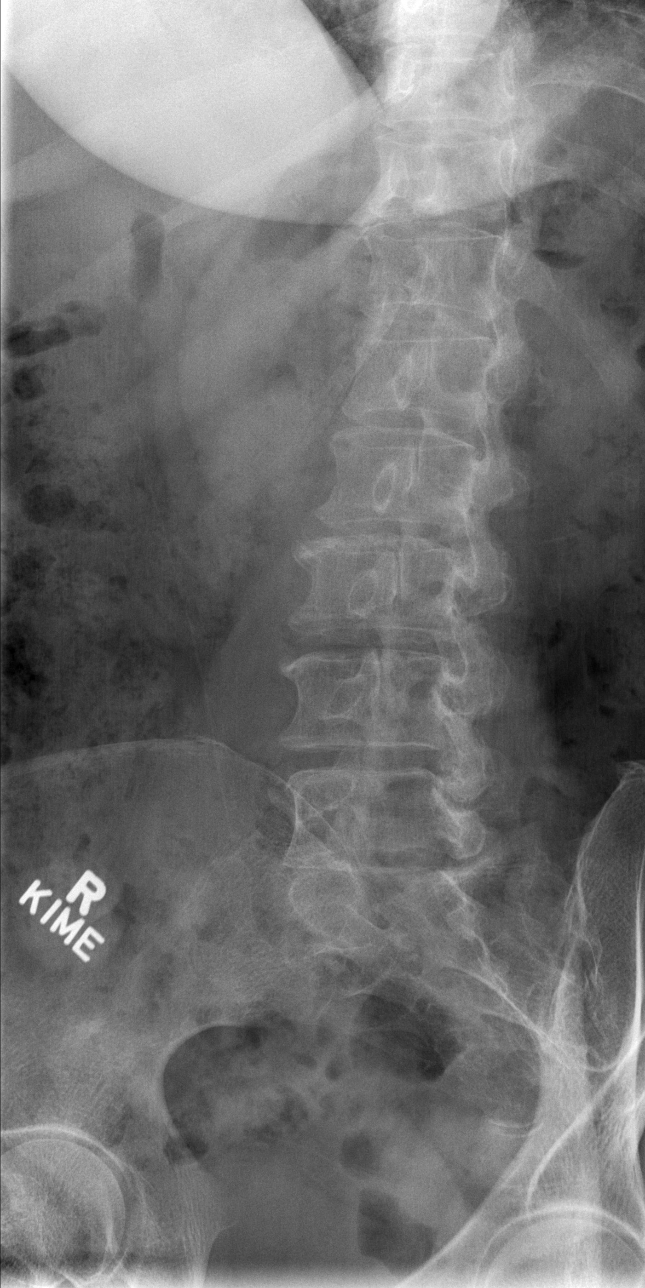

[t l-spine oblique exposure (2 of 2)]
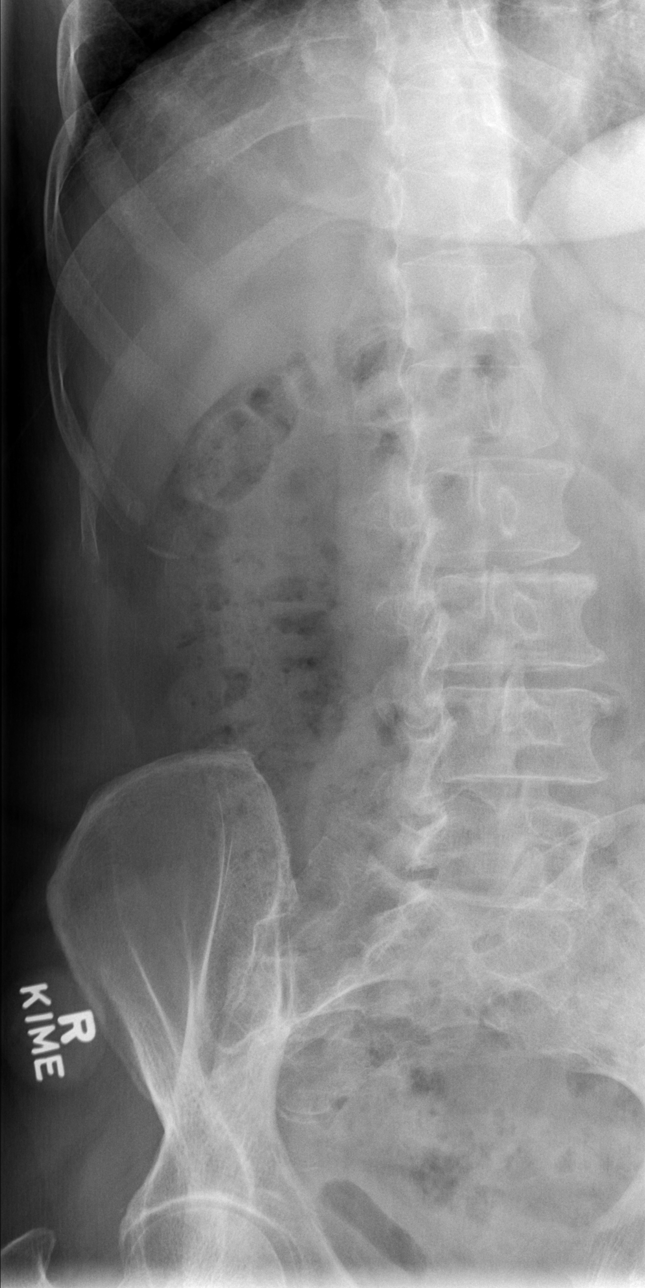

[t l-spine lat]
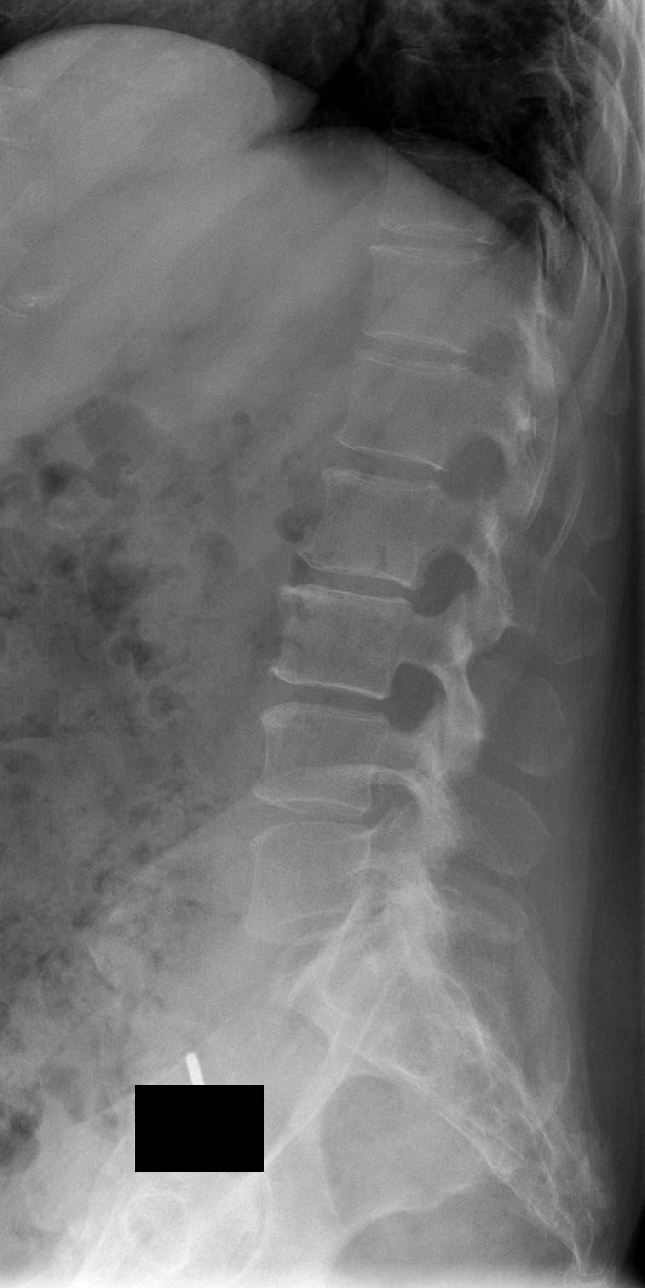

[t l-spine l5-s1 spot]
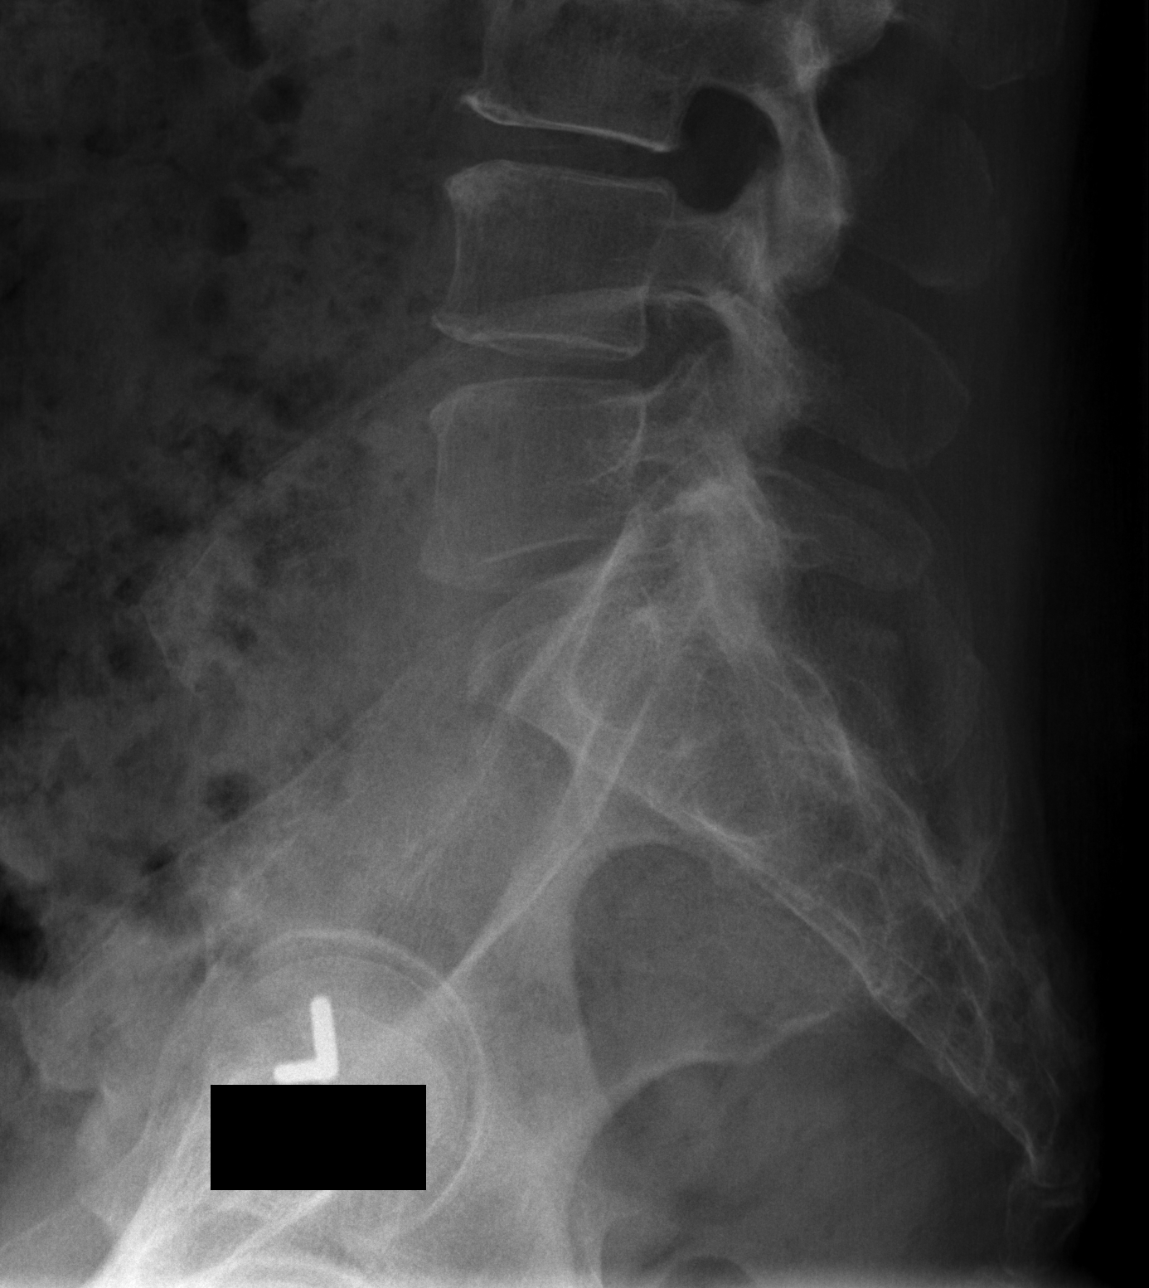

[5 of 5 positions shown; findings below may reference images not displayed]

FINDINGS: Five lumbar type vertebral segments. Vertebral body heights and
alignment are maintained. No fracture identified. Intervertebral
disc spaces are relatively preserved. Minimal degenerative endplate
changes. Mild lower lumbar facet arthrosis.
IMPRESSION: Mild degenerative changes of the lumbar spine without acute osseous
abnormality.

## 2021-07-20 ENCOUNTER — Emergency Department (HOSPITAL_BASED_OUTPATIENT_CLINIC_OR_DEPARTMENT_OTHER)
Admission: EM | Admit: 2021-07-20 | Discharge: 2021-07-20 | Disposition: A | Discharge status: Home / Self Care | Payer: Medicare Other | Attending: Emergency Medicine | Admitting: Emergency Medicine

## 2021-07-20 ENCOUNTER — Other Ambulatory Visit: Payer: Self-pay

## 2021-07-20 ENCOUNTER — Encounter (HOSPITAL_BASED_OUTPATIENT_CLINIC_OR_DEPARTMENT_OTHER): Payer: Self-pay

## 2021-07-20 DIAGNOSIS — I739 Peripheral vascular disease, unspecified: Secondary | ICD-10-CM

## 2021-07-20 DIAGNOSIS — M79604 Pain in right leg: Secondary | ICD-10-CM | POA: Insufficient documentation

## 2021-07-20 DIAGNOSIS — R202 Paresthesia of skin: Secondary | ICD-10-CM | POA: Diagnosis not present

## 2021-07-20 LAB — CBC WITH DIFFERENTIAL/PLATELET
Abs Immature Granulocytes: 0.01 10*3/uL (ref 0.00–0.07)
Basophils Absolute: 0 10*3/uL (ref 0.0–0.1)
Basophils Relative: 1 %
Eosinophils Absolute: 0.1 10*3/uL (ref 0.0–0.5)
Eosinophils Relative: 1 %
HCT: 45.5 % (ref 36.0–46.0)
Hemoglobin: 14.8 g/dL (ref 12.0–15.0)
Immature Granulocytes: 0 %
Lymphocytes Relative: 34 %
Lymphs Abs: 1.7 10*3/uL (ref 0.7–4.0)
MCH: 27.2 pg (ref 26.0–34.0)
MCHC: 32.5 g/dL (ref 30.0–36.0)
MCV: 83.6 fL (ref 80.0–100.0)
Monocytes Absolute: 0.4 10*3/uL (ref 0.1–1.0)
Monocytes Relative: 8 %
Neutro Abs: 2.8 10*3/uL (ref 1.7–7.7)
Neutrophils Relative %: 56 %
Platelets: 206 10*3/uL (ref 150–400)
RBC: 5.44 MIL/uL — ABNORMAL HIGH (ref 3.87–5.11)
RDW: 12.9 % (ref 11.5–15.5)
WBC: 5 10*3/uL (ref 4.0–10.5)
nRBC: 0 % (ref 0.0–0.2)

## 2021-07-20 LAB — COMPREHENSIVE METABOLIC PANEL
ALT: 21 U/L (ref 0–44)
AST: 23 U/L (ref 15–41)
Albumin: 4.8 g/dL (ref 3.5–5.0)
Alkaline Phosphatase: 119 U/L (ref 38–126)
Anion gap: 10 (ref 5–15)
BUN: 12 mg/dL (ref 6–20)
CO2: 25 mmol/L (ref 22–32)
Calcium: 9.8 mg/dL (ref 8.9–10.3)
Chloride: 103 mmol/L (ref 98–111)
Creatinine, Ser: 0.73 mg/dL (ref 0.44–1.00)
GFR, Estimated: >60 mL/min (ref >60)
Glucose, Bld: 100 mg/dL — ABNORMAL HIGH (ref 70–99)
Potassium: 4 mmol/L (ref 3.5–5.1)
Sodium: 138 mmol/L (ref 135–145)
Total Bilirubin: 0.4 mg/dL (ref 0.3–1.2)
Total Protein: 8.1 g/dL (ref 6.5–8.1)

## 2021-07-20 MED ORDER — ASPIRIN 325 MG PO TABS
325.0000 mg | ORAL_TABLET | Freq: Once | ORAL | Status: AC
  Administered 2021-07-20: 325 mg via ORAL
  Filled 2021-07-20: qty 1

## 2021-07-20 MED ORDER — ASPIRIN 81 MG PO CHEW: 81.0000 mg | CHEWABLE_TABLET | Freq: Every day | ORAL | 0 refills | Status: DC

## 2021-07-20 NOTE — Discharge Instructions (Addendum)
You likely have claudication.  Please take aspirin 81 mg daily.  I referred you to vascular surgery.  If they do not call you by Friday, please call the office for appointment  Return to ER if you have worse leg pain, foot turns cold or blue

## 2021-07-20 NOTE — ED Triage Notes (Addendum)
Through staff spanish interpreter and daughter-Pt c/o pain, tingling to right LE, "feels cold" x 2 years-worse x 6 mos-denies injury-NAD-steady gait

## 2021-07-20 NOTE — ED Provider Notes (Signed)
Cheryl White Provider Note   CSN: [id number] Arrival date & time: 07/20/21  2021     History  Chief Complaint  Patient presents with   Leg Problem    Cheryl White is a 61 y.o. female here presenting with right leg pain and coldness in the leg.  Patient states that she has been having coldness to the right leg for the last 2 years or so.  Patient states that it is worse when she walks.  Patient states that she has been having some tingling sensation in the right leg.  Patient called the nurse line and was told to come to the ER for evaluation.  Patient has never seen vascular surgery before.  Patient denies smoking history or history of vascular issues.  Denies abdominal pain  The history is provided by the patient.      Home Medications Prior to Admission medications   Medication Sig Start Date End Date Taking? Authorizing Provider  acetic acid-hydrocortisone (VOSOL-HC) OTIC solution Place 3 drops into the left ear 2 (two) times daily. 08/21/17   Mikell, Jeani Sow, MD  clotrimazole (LOTRIMIN) 1 % external solution Apply 1 application topically daily. 1 drop in left ear 09/20/17   Mikell, Jeani Sow, MD  docusate sodium (COLACE) 100 MG capsule Take 1 capsule (100 mg total) 2 (two) times daily by mouth. 05/25/17   Mikell, Jeani Sow, MD  fluticasone (FLONASE) 50 MCG/ACT nasal spray Place 2 sprays into both nostrils daily. 11/02/17   Mikell, Jeani Sow, MD  guaiFENesin-dextromethorphan (ROBITUSSIN DM) 100-10 MG/5ML syrup Take 5 mLs by mouth every 4 (four) hours as needed for cough. 11/02/17   Mikell, Jeani Sow, MD  hydroxychloroquine (PLAQUENIL) 200 MG tablet Take by mouth daily.    [provider]  hydrOXYzine (ATARAX/VISTARIL) 10 MG tablet Take 1 tablet (10 mg total) by mouth 3 (three) times daily as needed. 07/18/17   Mikell, Jeani Sow, MD  loratadine (CLARITIN) 10 MG tablet Take 1 tablet (10 mg total) by mouth daily. 11/02/17   Mikell,  Jeani Sow, MD  Olopatadine HCl (PATADAY) 0.2 % SOLN Apply 1 drop to eye 2 (two) times daily. 06/08/17   Mikell, Jeani Sow, MD  pantoprazole (PROTONIX) 40 MG tablet Take 1 tablet (40 mg total) daily by mouth. 05/25/17   Mikell, Jeani Sow, MD  polyethylene glycol powder (GLYCOLAX/MIRALAX) powder Take 17 g 2 (two) times daily as needed by mouth. 05/25/17   Mikell, Jeani Sow, MD  predniSONE (DELTASONE) 10 MG tablet Take 4 tablets (40 mg total) by mouth daily. 04/24/19   Fredia Sorrow, MD      Allergies    Lidocaine and Shellfish allergy    Review of Systems   Review of Systems  Musculoskeletal:        R leg pain   All other systems reviewed and are negative.  Physical Exam Updated Vital Signs BP 124/61 (BP Location: Left Arm)    Pulse 75    Temp 98.1 F (36.7 C) (Oral)    Resp 18    Ht 5\' 1"  (1.549 m)    Wt 56.7 kg    SpO2 100%    BMI 23.62 kg/m  Physical Exam Vitals and nursing note reviewed.  HENT:     Head: Normocephalic.     Nose: Nose normal.     Mouth/Throat:     Mouth: Mucous membranes are moist.  Eyes:     Extraocular Movements: Extraocular movements intact.  Pupils: Pupils are equal, round, and reactive to light.  Cardiovascular:     Rate and Rhythm: Normal rate and regular rhythm.     Pulses: Normal pulses.  Pulmonary:     Effort: Pulmonary effort is normal.  Abdominal:     General: Abdomen is flat.  Musculoskeletal:     Cervical back: Normal range of motion and neck supple.     Comments: Right foot is slightly colder than the left.  Patient has slightly diminished capillary refill on the right foot.  However patient does have a palpable right DP and PT pulse.  Patient also has popliteal pulses bilaterally.  Patient has no calf tenderness  Skin:    General: Skin is warm.     Capillary Refill: Capillary refill takes less than 2 seconds.  Neurological:     General: No focal deficit present.     Mental Status: She is alert and oriented to person,  place, and time.  Psychiatric:        Mood and Affect: Mood normal.        Behavior: Behavior normal.    ED Results / Procedures / Treatments   Labs (all labs ordered are listed, but only abnormal results are displayed) Labs Reviewed  CBC WITH DIFFERENTIAL/PLATELET - Abnormal; Notable for the following components:      Result Value   RBC 5.44 (*)    All other components within normal limits  COMPREHENSIVE METABOLIC PANEL - Abnormal; Notable for the following components:   Glucose, Bld 100 (*)    All other components within normal limits    EKG None  Radiology No results found.  Procedures Procedures    Medications Ordered in ED Medications  aspirin tablet 325 mg (325 mg Oral Given 07/20/21 2153)    ED Course/ Medical Decision Making/ A&P                           Medical Decision Making Cheryl White is a 61 y.o. female here presenting with right leg numbness and coldness.  Symptoms are worse when she walks.  I suspect underlying claudication that is chronic in nature.  This been going on for the last 2 years or so.  Patient does have palpable pulse on the right DP and PT.  I do not think she has an acute embolic arterial occlusion.  I think she has chronic claudication that is slowly getting worse.  Plan to get basic blood work and will refer to vascular surgery outpatient  10:57 PM CBC and BMP unremarkable.  Patient given aspirin.  Patient will be referred to vascular surgery outpatient and may need an ABI or CT outpatient    Amount and/or Complexity of Data Reviewed Independent Historian: caregiver    Details: daughter translating External Data Reviewed: labs. Labs: ordered. Decision-making details documented in ED Course.   Final Clinical Impression(s) / ED Diagnoses Final diagnoses:  None    Rx / DC Orders ED Discharge Orders     None         Drenda Freeze, MD 07/20/21 2258

## 2021-07-26 ENCOUNTER — Other Ambulatory Visit: Payer: Self-pay

## 2021-07-26 DIAGNOSIS — I739 Peripheral vascular disease, unspecified: Secondary | ICD-10-CM

## 2021-08-09 ENCOUNTER — Ambulatory Visit (HOSPITAL_COMMUNITY)
Admission: RE | Admit: 2021-08-09 | Discharge: 2021-08-09 | Disposition: A | Discharge status: Home / Self Care | Payer: Medicare Other | Source: Ambulatory Visit | Attending: Vascular Surgery | Admitting: Vascular Surgery

## 2021-08-09 ENCOUNTER — Encounter: Payer: Self-pay | Admitting: Vascular Surgery

## 2021-08-09 ENCOUNTER — Other Ambulatory Visit: Payer: Self-pay

## 2021-08-09 ENCOUNTER — Ambulatory Visit (INDEPENDENT_AMBULATORY_CARE_PROVIDER_SITE_OTHER): Payer: Medicare Other | Admitting: Vascular Surgery

## 2021-08-09 DIAGNOSIS — I739 Peripheral vascular disease, unspecified: Secondary | ICD-10-CM | POA: Insufficient documentation

## 2021-08-09 DIAGNOSIS — M79606 Pain in leg, unspecified: Secondary | ICD-10-CM | POA: Insufficient documentation

## 2021-08-09 DIAGNOSIS — M79604 Pain in right leg: Secondary | ICD-10-CM | POA: Diagnosis not present

## 2021-08-09 NOTE — Progress Notes (Signed)
Patient name: Cheryl White MRN: [id number] DOB: 1960/11/21 Sex: female  REASON FOR CONSULT: Evaluate for vascular claudication  HPI: Cheryl White is a 61 y.o. female, with history of lupus and Sjogren's disease who presents for evaluation of possible vascular claudication.  Patient was referred by the ED after she presented with several years of right lower extremity pain.  Apparently this started after she hit her hip on a bedpost several years ago.  She feels like the foot is cool and she also has some pain radiating from the foot upwards.  No specific cramping when walking.  States she can walk up to 30 minutes without stopping.  No previous vascular interventions.  Does not smoke.  Past Medical History:  Diagnosis Date   Lupus (HCC)    Raynaud's disease    Sjogren's disease (HCC)     Past Surgical History:  Procedure Laterality Date   BREAST SURGERY     CESAREAN SECTION     TUBAL LIGATION      Family History  Problem Relation Age of Onset   Diabetes Mother    Diabetes Father    Arthritis/Rheumatoid Father    Diabetes Brother    Heart attack Paternal Grandfather     SOCIAL HISTORY: Social History   Socioeconomic History   Marital status: Divorced    Spouse name: Not on file   Number of children: Not on file   Years of education: Not on file   Highest education level: Not on file  Occupational History   Occupation: Cleaning houses  Tobacco Use   Smoking status: Never   Smokeless tobacco: Never  Vaping Use   Vaping Use: Never used  Substance and Sexual Activity   Alcohol use: No   Drug use: No   Sexual activity: Not Currently    Comment: Has been Divorced for 12 years   Other Topics Concern   Not on file  Social History Narrative   Patient divorced 12 years. Patient had three children - two boys and 1 girl in there 23s- 30s. Eldest is  studying education in [place] - getting PHD.    Patient lives on her own in [place].    Social Determinants of Health    Financial Resource Strain: Not on file  Food Insecurity: Not on file  Transportation Needs: Not on file  Physical Activity: Not on file  Stress: Not on file  Social Connections: Not on file  Intimate Partner Violence: Not on file    Allergies  Allergen Reactions   Lidocaine     Allergy actually to Septocaine, not Lidocaine. Palpation and SOB   Shellfish Allergy     Current Outpatient Medications  Medication Sig Dispense Refill   acetic acid-hydrocortisone (VOSOL-HC) OTIC solution Place 3 drops into the left ear 2 (two) times daily. 10 mL 0   aspirin 81 MG chewable tablet Chew 1 tablet (81 mg total) by mouth daily. 30 tablet 0   clotrimazole (LOTRIMIN) 1 % external solution Apply 1 application topically daily. 1 drop in left ear 30 mL 0   docusate sodium (COLACE) 100 MG capsule Take 1 capsule (100 mg total) 2 (two) times daily by mouth. 30 capsule 0   fluticasone (FLONASE) 50 MCG/ACT nasal spray Place 2 sprays into both nostrils daily. 16 g 6   guaiFENesin-dextromethorphan (ROBITUSSIN DM) 100-10 MG/5ML syrup Take 5 mLs by mouth every 4 (four) hours as needed for cough. 118 mL 0   hydroxychloroquine (PLAQUENIL) 200 MG  tablet Take by mouth daily.     hydrOXYzine (ATARAX/VISTARIL) 10 MG tablet Take 1 tablet (10 mg total) by mouth 3 (three) times daily as needed. 30 tablet 0   loratadine (CLARITIN) 10 MG tablet Take 1 tablet (10 mg total) by mouth daily. 30 tablet 11   Olopatadine HCl (PATADAY) 0.2 % SOLN Apply 1 drop to eye 2 (two) times daily. 1 Bottle 0   pantoprazole (PROTONIX) 40 MG tablet Take 1 tablet (40 mg total) daily by mouth. 30 tablet 3   polyethylene glycol powder (GLYCOLAX/MIRALAX) powder Take 17 g 2 (two) times daily as needed by mouth. 3350 g 1   predniSONE (DELTASONE) 10 MG tablet Take 4 tablets (40 mg total) by mouth daily. (Patient not taking: Reported on 08/09/2021) 20 tablet 0   No current facility-administered medications for this visit.    REVIEW OF SYSTEMS:   [X]  denotes positive finding, [ ]  denotes negative finding Cardiac  Comments:  Chest pain or chest pressure:    Shortness of breath upon exertion:    Short of breath when lying flat:    Irregular heart rhythm:        Vascular    Pain in calf, thigh, or hip brought on by ambulation:    Pain in feet at night that wakes you up from your sleep:     Blood clot in your veins:    Leg swelling:         Pulmonary    Oxygen at home:    Productive cough:     Wheezing:         Neurologic    Sudden weakness in arms or legs:     Sudden numbness in arms or legs:     Sudden onset of difficulty speaking or slurred speech:    Temporary loss of vision in one eye:     Problems with dizziness:         Gastrointestinal    Blood in stool:     Vomited blood:         Genitourinary    Burning when urinating:     Blood in urine:        Psychiatric    Major depression:         Hematologic    Bleeding problems:    Problems with blood clotting too easily:        Skin    Rashes or ulcers:        Constitutional    Fever or chills:      PHYSICAL EXAM: Vitals:   08/09/21 0841  BP: 108/71  Pulse: 63  Resp: 14  Temp: 98.1 F (36.7 C)  TempSrc: Temporal  SpO2: 98%  Weight: 124 lb (56.2 kg)  Height: 5\' 1"  (1.549 m)    GENERAL: The patient is a well-nourished female, in no acute distress. The vital signs are documented above. CARDIAC: There is a regular rate and rhythm.  VASCULAR:  Palpable femoral pulses bilaterally Palpable popliteal pulses bilaterally Palpable DP pulses bilaterally PULMONARY: No respiratory distress. ABDOMEN: Soft and non-tender. MUSCULOSKELETAL: There are no major deformities or cyanosis. NEUROLOGIC: No focal weakness or paresthesias are detected. SKIN: There are no ulcers or rashes noted. PSYCHIATRIC: The patient has a normal affect.  DATA:   ABIs today are 1.18 on the right triphasic and 1.21 on the left triphasic with no evidence of arterial  disease.  Assessment/Plan:  61 year old female presents for evaluation of possible vascular claudication given several years of  pain particularly in the right leg.  I discussed with her and her daughter, I do not feel this is related to arterial insufficiency.  Does not sound like classic vascular claudication and she specifically she has no calf cramping when ambulating and can walk up to 30 minutes.  She has easily palpable pedal pulses on exam with normal ABIs greater than 1 and a normal triphasic waveform at the ankle.  She has no signs of arterial insufficiency.  She can follow with me as needed.  Discussed she follow-up with her PCP for further evaluation for other underlying etiologies   Cephus Shellinghristopher J. Chasten Blaze, MD [facility] Office: [phone]

## 2021-08-22 DIAGNOSIS — K839 Disease of biliary tract, unspecified: Secondary | ICD-10-CM | POA: Diagnosis not present

## 2021-08-22 DIAGNOSIS — K7689 Other specified diseases of liver: Secondary | ICD-10-CM | POA: Diagnosis not present

## 2021-08-22 DIAGNOSIS — K769 Liver disease, unspecified: Secondary | ICD-10-CM | POA: Diagnosis not present

## 2021-08-25 ENCOUNTER — Other Ambulatory Visit: Payer: Self-pay

## 2021-08-25 ENCOUNTER — Encounter (HOSPITAL_BASED_OUTPATIENT_CLINIC_OR_DEPARTMENT_OTHER): Payer: Self-pay | Admitting: Emergency Medicine

## 2021-08-25 ENCOUNTER — Emergency Department (HOSPITAL_BASED_OUTPATIENT_CLINIC_OR_DEPARTMENT_OTHER)
Admission: EM | Admit: 2021-08-25 | Discharge: 2021-08-25 | Disposition: A | Discharge status: Home / Self Care | Payer: Medicare Other | Attending: Emergency Medicine | Admitting: Emergency Medicine

## 2021-08-25 DIAGNOSIS — Z7982 Long term (current) use of aspirin: Secondary | ICD-10-CM | POA: Insufficient documentation

## 2021-08-25 DIAGNOSIS — H5712 Ocular pain, left eye: Secondary | ICD-10-CM | POA: Diagnosis not present

## 2021-08-25 DIAGNOSIS — F419 Anxiety disorder, unspecified: Secondary | ICD-10-CM | POA: Insufficient documentation

## 2021-08-25 DIAGNOSIS — R42 Dizziness and giddiness: Secondary | ICD-10-CM | POA: Diagnosis present

## 2021-08-25 DIAGNOSIS — R9431 Abnormal electrocardiogram [ECG] [EKG]: Secondary | ICD-10-CM | POA: Diagnosis not present

## 2021-08-25 DIAGNOSIS — G514 Facial myokymia: Secondary | ICD-10-CM

## 2021-08-25 DIAGNOSIS — E876 Hypokalemia: Secondary | ICD-10-CM | POA: Insufficient documentation

## 2021-08-25 LAB — CBC WITH DIFFERENTIAL/PLATELET
Abs Immature Granulocytes: 0.03 10*3/uL (ref 0.00–0.07)
Basophils Absolute: 0 10*3/uL (ref 0.0–0.1)
Basophils Relative: 0 %
Eosinophils Absolute: 0.1 10*3/uL (ref 0.0–0.5)
Eosinophils Relative: 1 %
HCT: 39.9 % (ref 36.0–46.0)
Hemoglobin: 13 g/dL (ref 12.0–15.0)
Immature Granulocytes: 1 %
Lymphocytes Relative: 27 %
Lymphs Abs: 1.4 10*3/uL (ref 0.7–4.0)
MCH: 27 pg (ref 26.0–34.0)
MCHC: 32.6 g/dL (ref 30.0–36.0)
MCV: 82.8 fL (ref 80.0–100.0)
Monocytes Absolute: 0.4 10*3/uL (ref 0.1–1.0)
Monocytes Relative: 8 %
Neutro Abs: 3.3 10*3/uL (ref 1.7–7.7)
Neutrophils Relative %: 63 %
Platelets: 138 10*3/uL — ABNORMAL LOW (ref 150–400)
RBC: 4.82 MIL/uL (ref 3.87–5.11)
RDW: 12.7 % (ref 11.5–15.5)
WBC: 5.3 10*3/uL (ref 4.0–10.5)
nRBC: 0 % (ref 0.0–0.2)

## 2021-08-25 LAB — BASIC METABOLIC PANEL
Anion gap: 9 (ref 5–15)
BUN: 20 mg/dL (ref 6–20)
CO2: 23 mmol/L (ref 22–32)
Calcium: 9 mg/dL (ref 8.9–10.3)
Chloride: 106 mmol/L (ref 98–111)
Creatinine, Ser: 0.73 mg/dL (ref 0.44–1.00)
GFR, Estimated: >60 mL/min (ref >60)
Glucose, Bld: 97 mg/dL (ref 70–99)
Potassium: 3.3 mmol/L — ABNORMAL LOW (ref 3.5–5.1)
Sodium: 138 mmol/L (ref 135–145)

## 2021-08-25 MED ORDER — TETRACAINE HCL 0.5 % OP SOLN
1.0000 [drp] | Freq: Once | OPHTHALMIC | Status: AC
  Administered 2021-08-25: 1 [drp] via OPHTHALMIC
  Filled 2021-08-25: qty 4

## 2021-08-25 MED ORDER — FLUORESCEIN SODIUM 1 MG OP STRP
1.0000 | ORAL_STRIP | Freq: Once | OPHTHALMIC | Status: AC
  Administered 2021-08-25: 1 via OPHTHALMIC
  Filled 2021-08-25: qty 1

## 2021-08-25 MED ORDER — POTASSIUM CHLORIDE CRYS ER 20 MEQ PO TBCR
40.0000 meq | EXTENDED_RELEASE_TABLET | Freq: Once | ORAL | Status: AC
Start: 2021-08-25 — End: 2021-08-25
  Administered 2021-08-25: 40 meq via ORAL
  Filled 2021-08-25: qty 2

## 2021-08-25 MED ORDER — POTASSIUM CHLORIDE ER 10 MEQ PO TBCR: 10.0000 meq | EXTENDED_RELEASE_TABLET | Freq: Every day | ORAL | 0 refills | Status: DC

## 2021-08-25 NOTE — ED Triage Notes (Signed)
Patient arrived via POV c/o bilateral eye issues with a burning feeling down into cheek. Patient states hx of bell's palsy. Patient is AO x 4, VS WDL, normal gait.

## 2021-08-25 NOTE — ED Provider Notes (Signed)
[facility] EMERGENCY DEPARTMENT Provider Note   CSN: [id number] Arrival date & time: 08/25/21  2041     History  Chief Complaint  Patient presents with   Eye Problem    Cheryl White is a 61 y.o. female with PMHx lupus, sjogren, anxiety who presents to the ED today with complaint of eye problem. Interpretor used at bedside. Pt describes "twitching" sensation to eyelids for the past 3 days. She also complains of a "burning" sensation around her mouth. She states that she had left eye pain while checking in and feels like she has a FB sensation in her left eye. She mentions hx of Bell's palsy about 20 years ago and is concerned she could be having similar symptoms. She mentions calling a health line who advised she come to the ED for further evaluation. Pt also complains of intermittent feelings of dizziness and tingling sensation in her fingertips at times. She states she had similar symptoms about 6 months ago and saw her PCP who prescribed her some medication which she did not take. She cannot recall the name of the medications. She is not currently having episodes of dizziness. Pt then pulls out all of her medications from her purse and states currently she is only taking plaquenil. She is prescribed buspar but states she does not take it anymore as it made her have difficulty sleeping.   The history is provided by the patient and medical records. The history is limited by a language barrier. A language interpreter was used.      Home Medications Prior to Admission medications   Medication Sig Start Date End Date Taking? Authorizing Provider  potassium chloride (KLOR-CON) 10 MEQ tablet Take 1 tablet (10 mEq total) by mouth daily for 5 days. 08/25/21 08/30/21 Yes Corbyn Steedman, PA-C  acetic acid-hydrocortisone (VOSOL-HC) OTIC solution Place 3 drops into the left ear 2 (two) times daily. 08/21/17   Mikell, Antionette PolesAsiyah Zahra, MD  aspirin 81 MG chewable tablet Chew 1 tablet (81 mg total)  by mouth daily. 07/20/21   Charlynne PanderYao, David Hsienta, MD  clotrimazole (LOTRIMIN) 1 % external solution Apply 1 application topically daily. 1 drop in left ear 09/20/17   Mikell, Antionette PolesAsiyah Zahra, MD  docusate sodium (COLACE) 100 MG capsule Take 1 capsule (100 mg total) 2 (two) times daily by mouth. 05/25/17   Mikell, Antionette PolesAsiyah Zahra, MD  fluticasone (FLONASE) 50 MCG/ACT nasal spray Place 2 sprays into both nostrils daily. 11/02/17   Mikell, Antionette PolesAsiyah Zahra, MD  guaiFENesin-dextromethorphan (ROBITUSSIN DM) 100-10 MG/5ML syrup Take 5 mLs by mouth every 4 (four) hours as needed for cough. 11/02/17   Mikell, Antionette PolesAsiyah Zahra, MD  hydroxychloroquine (PLAQUENIL) 200 MG tablet Take by mouth daily.    [provider]  hydrOXYzine (ATARAX/VISTARIL) 10 MG tablet Take 1 tablet (10 mg total) by mouth 3 (three) times daily as needed. 07/18/17   Mikell, Antionette PolesAsiyah Zahra, MD  loratadine (CLARITIN) 10 MG tablet Take 1 tablet (10 mg total) by mouth daily. 11/02/17   Mikell, Antionette PolesAsiyah Zahra, MD  Olopatadine HCl (PATADAY) 0.2 % SOLN Apply 1 drop to eye 2 (two) times daily. 06/08/17   Mikell, Antionette PolesAsiyah Zahra, MD  pantoprazole (PROTONIX) 40 MG tablet Take 1 tablet (40 mg total) daily by mouth. 05/25/17   Mikell, Antionette PolesAsiyah Zahra, MD  polyethylene glycol powder (GLYCOLAX/MIRALAX) powder Take 17 g 2 (two) times daily as needed by mouth. 05/25/17   Mikell, Antionette PolesAsiyah Zahra, MD  predniSONE (DELTASONE) 10 MG tablet Take 4 tablets (40 mg  total) by mouth daily. Patient not taking: Reported on 08/09/2021 04/24/19   Vanetta Mulders, MD      Allergies    Lidocaine and Shellfish allergy    Review of Systems   Review of Systems  Constitutional:  Negative for fever.  Eyes:  Positive for pain. Negative for redness.       Eye twitching  Neurological:  Negative for dizziness, light-headedness and headaches.       + paresthesias  All other systems reviewed and are negative.  Physical Exam Updated Vital Signs BP 107/76    Pulse 80    Temp 98.6 F (37 C) (Oral)     Resp 17    Ht 5\' 1"  (1.549 m)    Wt 54.4 kg    SpO2 97%    BMI 22.67 kg/m  Physical Exam Vitals and nursing note reviewed.  Constitutional:      Appearance: She is not ill-appearing or diaphoretic.  HENT:     Head: Normocephalic and atraumatic.     Right Ear: Tympanic membrane and ear canal normal.     Left Ear: Tympanic membrane and ear canal normal.  Eyes:     Intraocular pressure: Left eye pressure is 18 mmHg.     Extraocular Movements: Extraocular movements intact.     Conjunctiva/sclera: Conjunctivae normal.     Pupils: Pupils are equal, round, and reactive to light.     Left eye: No corneal abrasion or fluorescein uptake.  Cardiovascular:     Rate and Rhythm: Normal rate and regular rhythm.     Pulses: Normal pulses.  Pulmonary:     Effort: Pulmonary effort is normal.     Breath sounds: Normal breath sounds. No wheezing, rhonchi or rales.  Abdominal:     Palpations: Abdomen is soft.     Tenderness: There is no abdominal tenderness.  Musculoskeletal:     Cervical back: Neck supple.  Skin:    General: Skin is warm and dry.  Neurological:     Mental Status: She is alert.     Comments: Alert and oriented to self, place, time and event.   Speech is fluent, clear without dysarthria or dysphasia.   Strength 5/5 in upper/lower extremities   Sensation intact in upper/lower extremities   Normal gait.  Negative Romberg. No pronator drift.  Normal finger-to-nose and feet tapping.  CN I not tested  CN II grossly intact visual fields bilaterally. Did not visualize posterior eye.  CN III, IV, VI PERRLA and EOMs intact bilaterally  CN V Intact sensation to sharp and light touch to the face  CN VII facial movements symmetric  CN VIII not tested  CN IX, X no uvula deviation, symmetric rise of soft palate  CN XI 5/5 SCM and trapezius strength bilaterally  CN XII Midline tongue protrusion, symmetric L/R movements      ED Results / Procedures / Treatments   Labs (all labs  ordered are listed, but only abnormal results are displayed) Labs Reviewed  CBC WITH DIFFERENTIAL/PLATELET - Abnormal; Notable for the following components:      Result Value   Platelets 138 (*)    All other components within normal limits  BASIC METABOLIC PANEL - Abnormal; Notable for the following components:   Potassium 3.3 (*)    All other components within normal limits    EKG None  Radiology No results found.  Procedures Procedures    Medications Ordered in ED Medications  tetracaine (PONTOCAINE) 0.5 % ophthalmic solution 1  drop (has no administration in time range)  fluorescein ophthalmic strip 1 strip (has no administration in time range)  potassium chloride SA (KLOR-CON M) CR tablet 40 mEq (has no administration in time range)    ED Course/ Medical Decision Making/ A&P                           Medical Decision Making 61 year old Spanish-speaking female who presents to the ED today with complaint of intermittent bilateral eye twitching, questionable foreign body sensation in left eye, tingling around mouth, intermittent tingling in hands.  Interpretive services used.  Symptoms have been ongoing for 3 days however that she states that she has had similar symptoms approximately 6 months ago.  She also mentions a history of Bell's palsy 20 years ago and is concerned her symptoms are returning.  On arrival to the ED vitals are stable.  Patient is afebrile, nontachycardic and nontachypneic.  On exam she has no focal neurodeficits.  She has equal sensation throughout her entire face.  Was equal round reactive to light.  Able to raise eyebrows without difficulty.  TMs clear bilaterally without signs of infection.  Exam does not seem consistent with Bell's palsy at this time.  No rash appreciated.  She does seem extremely anxious on exam.  She is prescribed BuSpar however states that she is not currently taking it.  Question if this could be attributing to her symptoms versus  potential for hypocalcemia with complaint of twitching and tingling to her face.  We will plan for labs.  We will also plan for fluorescein stain given complaint of questionable foreign body sensation in left eye.  Left conjunctiva is clear without signs of erythema.  Pupils equal round reactive to light and extraocular movements are intact.  Denies specific eye pain currently.   Problems Addressed: Facial twitching: acute illness or injury    Details: Hypokalemia of 3.3 today. Otherwise labs unremarkable. Calcium WNL. No focal neuro deficits on exam. Do not feel pt requires additional emergent workup in the ED today. Will dispo home with PCP follow up. Hypokalemia: acute illness or injury    Details: KDUR provided in the ED. Question if this could be attributing to pt's symptoms today. Will replete in the ED and discharge home with same. Pt instructed to follow up with her PCP for recheck of potassium.  Amount and/or Complexity of Data Reviewed External Data Reviewed: notes.    Details: Per chart review patient with history of recent thyroid ultrasound.  Has had fine-needle aspiration in the past.  Repeat ultrasound reassuring. Labs: ordered.    Details: CBC without leukocytosis. Hgb stable at 13.0 BMP with potassium 3.3. No other electrolyte abnormalities.  Risk Prescription drug management.           Final Clinical Impression(s) / ED Diagnoses Final diagnoses:  Facial twitching  Hypokalemia  Anxiety    Rx / DC Orders ED Discharge Orders          Ordered    potassium chloride (KLOR-CON) 10 MEQ tablet  Daily        08/25/21 2305             Discharge Instructions      Please pick up medication and take as prescribed for your low potassium today. It is recommended that you have your potassium level rechecked by your PCP.   Please also follow up with your PCP for further evaluation of your symptoms today.  It is recommended that you take all of your prescribed  medications including your Buspar for anxiety.   Return to the ED for any new/worsening symptoms        Tanda Rockers, PA-C 08/25/21 2306    Edwin Dada P, DO 08/26/21 1517

## 2021-08-25 NOTE — ED Notes (Signed)
Pt wears glasses all the time. Visual acuity screening was done with pt wearing glasses

## 2021-08-25 NOTE — Discharge Instructions (Signed)
Please pick up medication and take as prescribed for your low potassium today. It is recommended that you have your potassium level rechecked by your PCP.   Please also follow up with your PCP for further evaluation of your symptoms today.   It is recommended that you take all of your prescribed medications including your Buspar for anxiety.   Return to the ED for any new/worsening symptoms

## 2021-08-30 DIAGNOSIS — M1990 Unspecified osteoarthritis, unspecified site: Secondary | ICD-10-CM | POA: Diagnosis not present

## 2021-08-30 DIAGNOSIS — D72819 Decreased white blood cell count, unspecified: Secondary | ICD-10-CM | POA: Diagnosis not present

## 2021-08-30 DIAGNOSIS — I7300 Raynaud's syndrome without gangrene: Secondary | ICD-10-CM | POA: Diagnosis not present

## 2021-08-30 DIAGNOSIS — M79643 Pain in unspecified hand: Secondary | ICD-10-CM | POA: Diagnosis not present

## 2021-08-30 DIAGNOSIS — M329 Systemic lupus erythematosus, unspecified: Secondary | ICD-10-CM | POA: Diagnosis not present

## 2021-08-31 DIAGNOSIS — E785 Hyperlipidemia, unspecified: Secondary | ICD-10-CM | POA: Diagnosis not present

## 2021-08-31 DIAGNOSIS — M329 Systemic lupus erythematosus, unspecified: Secondary | ICD-10-CM | POA: Diagnosis not present

## 2021-08-31 DIAGNOSIS — E876 Hypokalemia: Secondary | ICD-10-CM | POA: Diagnosis not present

## 2021-08-31 DIAGNOSIS — Z7689 Persons encountering health services in other specified circumstances: Secondary | ICD-10-CM | POA: Diagnosis not present

## 2021-09-06 DIAGNOSIS — J3089 Other allergic rhinitis: Secondary | ICD-10-CM | POA: Diagnosis not present

## 2021-09-06 DIAGNOSIS — R051 Acute cough: Secondary | ICD-10-CM | POA: Diagnosis not present

## 2021-09-06 DIAGNOSIS — J020 Streptococcal pharyngitis: Secondary | ICD-10-CM | POA: Diagnosis not present

## 2021-09-06 DIAGNOSIS — J029 Acute pharyngitis, unspecified: Secondary | ICD-10-CM | POA: Diagnosis not present

## 2021-09-06 DIAGNOSIS — Z20822 Contact with and (suspected) exposure to covid-19: Secondary | ICD-10-CM | POA: Diagnosis not present

## 2021-09-21 DIAGNOSIS — Z789 Other specified health status: Secondary | ICD-10-CM | POA: Diagnosis not present

## 2021-09-21 DIAGNOSIS — Z136 Encounter for screening for cardiovascular disorders: Secondary | ICD-10-CM | POA: Diagnosis not present

## 2021-09-21 DIAGNOSIS — Z139 Encounter for screening, unspecified: Secondary | ICD-10-CM | POA: Diagnosis not present

## 2021-09-21 DIAGNOSIS — Z1211 Encounter for screening for malignant neoplasm of colon: Secondary | ICD-10-CM | POA: Diagnosis not present

## 2021-09-21 DIAGNOSIS — Z1389 Encounter for screening for other disorder: Secondary | ICD-10-CM | POA: Diagnosis not present

## 2021-09-21 DIAGNOSIS — Z0000 Encounter for general adult medical examination without abnormal findings: Secondary | ICD-10-CM | POA: Diagnosis not present

## 2021-09-23 DIAGNOSIS — R160 Hepatomegaly, not elsewhere classified: Secondary | ICD-10-CM | POA: Diagnosis not present

## 2021-09-23 DIAGNOSIS — M1990 Unspecified osteoarthritis, unspecified site: Secondary | ICD-10-CM | POA: Diagnosis not present

## 2021-09-23 DIAGNOSIS — J309 Allergic rhinitis, unspecified: Secondary | ICD-10-CM | POA: Diagnosis not present

## 2021-09-23 DIAGNOSIS — E041 Nontoxic single thyroid nodule: Secondary | ICD-10-CM | POA: Diagnosis not present

## 2021-09-23 DIAGNOSIS — M329 Systemic lupus erythematosus, unspecified: Secondary | ICD-10-CM | POA: Diagnosis not present

## 2021-09-23 DIAGNOSIS — K219 Gastro-esophageal reflux disease without esophagitis: Secondary | ICD-10-CM | POA: Diagnosis not present

## 2021-09-23 DIAGNOSIS — M810 Age-related osteoporosis without current pathological fracture: Secondary | ICD-10-CM | POA: Diagnosis not present

## 2021-09-23 DIAGNOSIS — G43909 Migraine, unspecified, not intractable, without status migrainosus: Secondary | ICD-10-CM | POA: Diagnosis not present

## 2021-09-23 DIAGNOSIS — Z6823 Body mass index (BMI) 23.0-23.9, adult: Secondary | ICD-10-CM | POA: Diagnosis not present

## 2021-09-23 DIAGNOSIS — Z0000 Encounter for general adult medical examination without abnormal findings: Secondary | ICD-10-CM | POA: Diagnosis not present

## 2021-09-23 DIAGNOSIS — I7300 Raynaud's syndrome without gangrene: Secondary | ICD-10-CM | POA: Diagnosis not present

## 2021-09-30 DIAGNOSIS — Z1212 Encounter for screening for malignant neoplasm of rectum: Secondary | ICD-10-CM | POA: Diagnosis not present

## 2021-09-30 DIAGNOSIS — Z1211 Encounter for screening for malignant neoplasm of colon: Secondary | ICD-10-CM | POA: Diagnosis not present

## 2021-10-05 DIAGNOSIS — R202 Paresthesia of skin: Secondary | ICD-10-CM | POA: Diagnosis not present

## 2021-10-05 DIAGNOSIS — Z6823 Body mass index (BMI) 23.0-23.9, adult: Secondary | ICD-10-CM | POA: Diagnosis not present

## 2021-10-06 DIAGNOSIS — R202 Paresthesia of skin: Secondary | ICD-10-CM | POA: Diagnosis not present

## 2021-10-09 LAB — COLOGUARD: COLOGUARD: NEGATIVE

## 2021-10-09 LAB — EXTERNAL GENERIC LAB PROCEDURE: COLOGUARD: NEGATIVE

## 2021-10-18 DIAGNOSIS — H1013 Acute atopic conjunctivitis, bilateral: Secondary | ICD-10-CM | POA: Diagnosis not present

## 2021-10-18 DIAGNOSIS — Z20822 Contact with and (suspected) exposure to covid-19: Secondary | ICD-10-CM | POA: Diagnosis not present

## 2021-10-18 DIAGNOSIS — Z6823 Body mass index (BMI) 23.0-23.9, adult: Secondary | ICD-10-CM | POA: Diagnosis not present

## 2021-10-18 DIAGNOSIS — J029 Acute pharyngitis, unspecified: Secondary | ICD-10-CM | POA: Diagnosis not present

## 2021-10-18 DIAGNOSIS — J020 Streptococcal pharyngitis: Secondary | ICD-10-CM | POA: Diagnosis not present

## 2021-10-26 DIAGNOSIS — J302 Other seasonal allergic rhinitis: Secondary | ICD-10-CM | POA: Diagnosis not present

## 2021-10-26 DIAGNOSIS — G43009 Migraine without aura, not intractable, without status migrainosus: Secondary | ICD-10-CM | POA: Diagnosis not present

## 2021-11-17 DIAGNOSIS — H1013 Acute atopic conjunctivitis, bilateral: Secondary | ICD-10-CM | POA: Diagnosis not present

## 2021-11-28 DIAGNOSIS — M25561 Pain in right knee: Secondary | ICD-10-CM | POA: Diagnosis not present

## 2021-11-28 DIAGNOSIS — M329 Systemic lupus erythematosus, unspecified: Secondary | ICD-10-CM | POA: Diagnosis not present

## 2021-11-28 DIAGNOSIS — M1990 Unspecified osteoarthritis, unspecified site: Secondary | ICD-10-CM | POA: Diagnosis not present

## 2021-11-28 DIAGNOSIS — M79643 Pain in unspecified hand: Secondary | ICD-10-CM | POA: Diagnosis not present

## 2021-11-28 DIAGNOSIS — I7300 Raynaud's syndrome without gangrene: Secondary | ICD-10-CM | POA: Diagnosis not present

## 2021-12-01 DIAGNOSIS — Z6821 Body mass index (BMI) 21.0-21.9, adult: Secondary | ICD-10-CM | POA: Diagnosis not present

## 2021-12-01 DIAGNOSIS — M810 Age-related osteoporosis without current pathological fracture: Secondary | ICD-10-CM | POA: Diagnosis not present

## 2021-12-22 DIAGNOSIS — Z6821 Body mass index (BMI) 21.0-21.9, adult: Secondary | ICD-10-CM | POA: Diagnosis not present

## 2021-12-22 DIAGNOSIS — R51 Headache with orthostatic component, not elsewhere classified: Secondary | ICD-10-CM | POA: Diagnosis not present

## 2021-12-22 DIAGNOSIS — R519 Headache, unspecified: Secondary | ICD-10-CM | POA: Diagnosis not present

## 2021-12-22 DIAGNOSIS — S0993XA Unspecified injury of face, initial encounter: Secondary | ICD-10-CM | POA: Diagnosis not present

## 2022-01-02 DIAGNOSIS — Z1231 Encounter for screening mammogram for malignant neoplasm of breast: Secondary | ICD-10-CM | POA: Diagnosis not present

## 2022-03-30 ENCOUNTER — Other Ambulatory Visit: Payer: Self-pay

## 2022-03-30 ENCOUNTER — Encounter (HOSPITAL_BASED_OUTPATIENT_CLINIC_OR_DEPARTMENT_OTHER): Payer: Self-pay | Admitting: Emergency Medicine

## 2022-03-30 ENCOUNTER — Emergency Department (HOSPITAL_BASED_OUTPATIENT_CLINIC_OR_DEPARTMENT_OTHER)
Admission: EM | Admit: 2022-03-30 | Discharge: 2022-03-30 | Disposition: A | Discharge status: Home / Self Care | Payer: Medicare Other | Attending: Emergency Medicine | Admitting: Emergency Medicine

## 2022-03-30 DIAGNOSIS — Z7982 Long term (current) use of aspirin: Secondary | ICD-10-CM | POA: Insufficient documentation

## 2022-03-30 DIAGNOSIS — J029 Acute pharyngitis, unspecified: Secondary | ICD-10-CM | POA: Diagnosis present

## 2022-03-30 DIAGNOSIS — B9789 Other viral agents as the cause of diseases classified elsewhere: Secondary | ICD-10-CM | POA: Diagnosis not present

## 2022-03-30 DIAGNOSIS — J028 Acute pharyngitis due to other specified organisms: Secondary | ICD-10-CM | POA: Diagnosis not present

## 2022-03-30 LAB — GROUP A STREP BY PCR: Group A Strep by PCR: NOT DETECTED

## 2022-03-30 MED ORDER — IBUPROFEN 600 MG PO TABS: 600.0000 mg | ORAL_TABLET | Freq: Four times a day (QID) | ORAL | 0 refills | Status: DC | PRN

## 2022-03-30 MED ORDER — PREDNISONE 10 MG PO TABS: 20.0000 mg | ORAL_TABLET | Freq: Every day | ORAL | 0 refills | Status: AC

## 2022-03-30 NOTE — ED Triage Notes (Signed)
Sore throat and cough since Saturday. Tested negative for covid yesterday. Seen by PCP yesterday for same. Pt with NAD, speaking in full sentences, and maintaining saliva.

## 2022-03-30 NOTE — Discharge Instructions (Signed)
Please take ibuprofen and prednisone as prescribed.  Drink warm tea with honey, sore throat lozenges can be purchased over-the-counter.  I recommend sleeping in a partially reclined/sitting up  position in a lazy boy or recliner until your symptoms improve.

## 2022-03-30 NOTE — ED Provider Notes (Signed)
[facility] EMERGENCY DEPARTMENT Provider Note   CSN: [id number] Arrival date & time: 03/30/22  1728     History  Chief Complaint  Patient presents with   Sore Throat    Cheryl White is a 61 y.o. female.   Sore Throat  Patient is a 61 year old Spanish-speaking female patient's daughter was used as interpreter at the patient's request--she prefers not to have a formal interpreter--patient states that she has had a sore throat for 5 days.  She states that she does negative for COVID yesterday and PCP office and was started on amoxicillin empirically for infection.  She denies any difficulty breathing lightheadedness or dizziness.  She states when she lays back flat to sleep she feels that she has trouble breathing sometimes however this is not of issue when she is sitting upright/awake.  No fevers, chest pain        Home Medications Prior to Admission medications   Medication Sig Start Date End Date Taking? Authorizing Provider  ibuprofen (ADVIL) 600 MG tablet Take 1 tablet (600 mg total) by mouth every 6 (six) hours as needed. 03/30/22  Yes Arvie Bartholomew S, PA  predniSONE (DELTASONE) 10 MG tablet Take 2 tablets (20 mg total) by mouth daily for 5 days. 03/30/22 04/04/22 Yes Marda Breidenbach, Kathleene Hazel, PA  acetic acid-hydrocortisone (VOSOL-HC) OTIC solution Place 3 drops into the left ear 2 (two) times daily. 08/21/17   Mikell, Jeani Sow, MD  aspirin 81 MG chewable tablet Chew 1 tablet (81 mg total) by mouth daily. 07/20/21   Drenda Freeze, MD  clotrimazole (LOTRIMIN) 1 % external solution Apply 1 application topically daily. 1 drop in left ear 09/20/17   Mikell, Jeani Sow, MD  docusate sodium (COLACE) 100 MG capsule Take 1 capsule (100 mg total) 2 (two) times daily by mouth. 05/25/17   Mikell, Jeani Sow, MD  fluticasone (FLONASE) 50 MCG/ACT nasal spray Place 2 sprays into both nostrils daily. 11/02/17   Mikell, Jeani Sow, MD  guaiFENesin-dextromethorphan (ROBITUSSIN  DM) 100-10 MG/5ML syrup Take 5 mLs by mouth every 4 (four) hours as needed for cough. 11/02/17   Mikell, Jeani Sow, MD  hydroxychloroquine (PLAQUENIL) 200 MG tablet Take by mouth daily.    [provider]  hydrOXYzine (ATARAX/VISTARIL) 10 MG tablet Take 1 tablet (10 mg total) by mouth 3 (three) times daily as needed. 07/18/17   Mikell, Jeani Sow, MD  loratadine (CLARITIN) 10 MG tablet Take 1 tablet (10 mg total) by mouth daily. 11/02/17   Mikell, Jeani Sow, MD  Olopatadine HCl (PATADAY) 0.2 % SOLN Apply 1 drop to eye 2 (two) times daily. 06/08/17   Mikell, Jeani Sow, MD  pantoprazole (PROTONIX) 40 MG tablet Take 1 tablet (40 mg total) daily by mouth. 05/25/17   Mikell, Jeani Sow, MD  polyethylene glycol powder (GLYCOLAX/MIRALAX) powder Take 17 g 2 (two) times daily as needed by mouth. 05/25/17   Mikell, Jeani Sow, MD  potassium chloride (KLOR-CON) 10 MEQ tablet Take 1 tablet (10 mEq total) by mouth daily for 5 days. 08/25/21 08/30/21  Eustaquio Maize, PA-C      Allergies    Lidocaine and Shellfish allergy    Review of Systems   Review of Systems  Physical Exam Updated Vital Signs BP 134/74   Pulse 78   Temp 99.2 F (37.3 C) (Oral)   Resp 18   Ht 5\' 1"  (1.549 m)   Wt 55 kg   SpO2 96%   BMI 22.91 kg/m  Physical Exam  Vitals and nursing note reviewed.  Constitutional:      General: She is not in acute distress. HENT:     Head: Normocephalic and atraumatic.     Nose: Nose normal.     Mouth/Throat:     Comments: Posterior pharynx with mild cobblestoning Eyes:     General: No scleral icterus. Cardiovascular:     Rate and Rhythm: Normal rate and regular rhythm.     Pulses: Normal pulses.     Heart sounds: Normal heart sounds.  Pulmonary:     Effort: Pulmonary effort is normal. No respiratory distress.     Breath sounds: No wheezing.  Abdominal:     Palpations: Abdomen is soft.     Tenderness: There is no abdominal tenderness.  Musculoskeletal:      Cervical back: Normal range of motion.     Right lower leg: No edema.     Left lower leg: No edema.  Skin:    General: Skin is warm and dry.     Capillary Refill: Capillary refill takes less than 2 seconds.  Neurological:     Mental Status: She is alert. Mental status is at baseline.  Psychiatric:        Mood and Affect: Mood normal.        Behavior: Behavior normal.     ED Results / Procedures / Treatments   Labs (all labs ordered are listed, but only abnormal results are displayed) Labs Reviewed  GROUP A STREP BY PCR    EKG None  Radiology No results found.  Procedures Procedures    Medications Ordered in ED Medications - No data to display  ED Course/ Medical Decision Making/ A&P                           Medical Decision Making Risk Prescription drug management.   Patient is a 61 year old Spanish-speaking female patient's daughter was used as interpreter at the patient's request--she prefers not to have a formal interpreter--patient states that she has had a sore throat for 5 days.  She states that she does negative for COVID yesterday and PCP office and was started on amoxicillin empirically for infection.  She denies any difficulty breathing lightheadedness or dizziness.  She states when she lays back flat to sleep she feels that she has trouble breathing sometimes however this is not of issue when she is sitting upright/awake.  No fevers, chest pain   Physical exam relatively unremarkable some mild cobblestoning  Recommend follow-up with PCP for continued observation of her symptoms.  Return precautions for any new or concerning symptoms or any difficulty breathing at all. Recommend that she sleep in recliner until her symptoms improve.  Group A strep PCR negative. Negative COVID test in her PCP office per patient  Patient has normal phonation per daughter.  Will prescribe short course of low-dose prednisone recommend throat lozenges ibuprofen Tylenol  and strict return precautions.  I did recommend that she finish her amoxicillin treatment since she was prescribed this and has already taken several doses.  Final Clinical Impression(s) / ED Diagnoses Final diagnoses:  Viral pharyngitis    Rx / DC Orders ED Discharge Orders          Ordered    predniSONE (DELTASONE) 10 MG tablet  Daily        03/30/22 1900    ibuprofen (ADVIL) 600 MG tablet  Every 6 hours PRN  03/30/22 1900              Gailen Shelter, PA 03/30/22 1913    Charlynne Pander, MD 04/04/22 1501

## 2022-04-12 ENCOUNTER — Emergency Department (HOSPITAL_BASED_OUTPATIENT_CLINIC_OR_DEPARTMENT_OTHER): Payer: Medicare Other

## 2022-04-12 ENCOUNTER — Encounter (HOSPITAL_BASED_OUTPATIENT_CLINIC_OR_DEPARTMENT_OTHER): Payer: Self-pay | Admitting: Emergency Medicine

## 2022-04-12 ENCOUNTER — Other Ambulatory Visit: Payer: Self-pay

## 2022-04-12 ENCOUNTER — Emergency Department (HOSPITAL_BASED_OUTPATIENT_CLINIC_OR_DEPARTMENT_OTHER)
Admission: EM | Admit: 2022-04-12 | Discharge: 2022-04-13 | Disposition: A | Discharge status: Home / Self Care | Payer: Medicare Other | Attending: Emergency Medicine | Admitting: Emergency Medicine

## 2022-04-12 DIAGNOSIS — R0789 Other chest pain: Secondary | ICD-10-CM

## 2022-04-12 DIAGNOSIS — Z79899 Other long term (current) drug therapy: Secondary | ICD-10-CM | POA: Diagnosis not present

## 2022-04-12 DIAGNOSIS — R0602 Shortness of breath: Secondary | ICD-10-CM | POA: Diagnosis not present

## 2022-04-12 DIAGNOSIS — R053 Chronic cough: Secondary | ICD-10-CM | POA: Diagnosis not present

## 2022-04-12 DIAGNOSIS — Z7982 Long term (current) use of aspirin: Secondary | ICD-10-CM | POA: Insufficient documentation

## 2022-04-12 DIAGNOSIS — R059 Cough, unspecified: Secondary | ICD-10-CM | POA: Diagnosis not present

## 2022-04-12 NOTE — ED Triage Notes (Signed)
Pt states she had braces placed in September, since has had cough and excess phlegm. Concerned she is having a allergic reaction to her braces. Seen here on 9/21 for same. Prescribed steroids and had some relief. Braces has since been removed, but sx persist.

## 2022-04-12 NOTE — ED Notes (Signed)
Pt ambulated to restroom. Steady gait

## 2022-04-13 DIAGNOSIS — R053 Chronic cough: Secondary | ICD-10-CM | POA: Diagnosis not present

## 2022-04-13 MED ORDER — PANTOPRAZOLE SODIUM 40 MG PO TBEC: 40.0000 mg | DELAYED_RELEASE_TABLET | Freq: Every day | ORAL | 1 refills | Status: DC

## 2022-04-13 MED ORDER — PANTOPRAZOLE SODIUM 40 MG PO TBEC
40.0000 mg | DELAYED_RELEASE_TABLET | Freq: Once | ORAL | Status: AC
  Administered 2022-04-13: 40 mg via ORAL
  Filled 2022-04-13: qty 1

## 2022-04-13 MED ORDER — ALBUTEROL SULFATE HFA 108 (90 BASE) MCG/ACT IN AERS
2.0000 | INHALATION_SPRAY | RESPIRATORY_TRACT | Status: DC | PRN
  Administered 2022-04-13: 2 via RESPIRATORY_TRACT
  Filled 2022-04-13: qty 6.7

## 2022-04-13 NOTE — ED Provider Notes (Signed)
[facility] Provider Note: Cheryl Dell, MD, FACEP  CSN: [id number] MRN: [id number] ARRIVAL: 04/12/22 at 2142 ROOM: [facility]06/[facility]06   CHIEF COMPLAINT  Cough   HISTORY OF PRESENT ILLNESS  04/13/22 12:33 AM Cheryl White is a 61 y.o. female who had braces placed on her teeth in September.  She almost immediately developed a cough and a sensation of excess phlegm in her throat.  She was seen here on 921 for the same and prescribed steroids with equivocal relief.  She has also been taking an over-the-counter cough medication.  Her braces have subsequently been removed.  Nevertheless her symptoms persist.  She is not short of breath.  She is not on an ACE inhibitor.  She has no history of GERD but does admit to a sour taste in her throat some mornings.  She does not use an albuterol inhaler.  She is not having actual throat pain.   Past Medical History:  Diagnosis Date   Lupus (HCC)    Raynaud's disease    Sjogren's disease (HCC)     Past Surgical History:  Procedure Laterality Date   BREAST SURGERY     CESAREAN SECTION     TUBAL LIGATION      Family History  Problem Relation Age of Onset   Diabetes Mother    Diabetes Father    Arthritis/Rheumatoid Father    Diabetes Brother    Heart attack Paternal Grandfather     Social History   Tobacco Use   Smoking status: Never   Smokeless tobacco: Never  Vaping Use   Vaping Use: Never used  Substance Use Topics   Alcohol use: No   Drug use: No    Prior to Admission medications   Medication Sig Start Date End Date Taking? Authorizing Provider  aspirin 81 MG chewable tablet Chew 1 tablet (81 mg total) by mouth daily. 07/20/21   Charlynne Pander, MD  docusate sodium (COLACE) 100 MG capsule Take 1 capsule (100 mg total) 2 (two) times daily by mouth. 05/25/17   Mikell, Antionette Poles, MD  fluticasone (FLONASE) 50 MCG/ACT nasal spray Place 2 sprays into both nostrils daily. 11/02/17   Mikell, Antionette Poles, MD   guaiFENesin-dextromethorphan (ROBITUSSIN DM) 100-10 MG/5ML syrup Take 5 mLs by mouth every 4 (four) hours as needed for cough. 11/02/17   Mikell, Antionette Poles, MD  hydroxychloroquine (PLAQUENIL) 200 MG tablet Take by mouth daily.    [provider]  hydrOXYzine (ATARAX/VISTARIL) 10 MG tablet Take 1 tablet (10 mg total) by mouth 3 (three) times daily as needed. 07/18/17   Mikell, Antionette Poles, MD  ibuprofen (ADVIL) 600 MG tablet Take 1 tablet (600 mg total) by mouth every 6 (six) hours as needed. 03/30/22   Gailen Shelter, PA  loratadine (CLARITIN) 10 MG tablet Take 1 tablet (10 mg total) by mouth daily. 11/02/17   Mikell, Antionette Poles, MD  Olopatadine HCl (PATADAY) 0.2 % SOLN Apply 1 drop to eye 2 (two) times daily. 06/08/17   Mikell, Antionette Poles, MD  pantoprazole (PROTONIX) 40 MG tablet Take 1 tablet (40 mg total) by mouth daily. 04/13/22   Jeilyn Reznik, MD  potassium chloride (KLOR-CON) 10 MEQ tablet Take 1 tablet (10 mEq total) by mouth daily for 5 days. 08/25/21 08/30/21  Tanda Rockers, PA-C    Allergies Lidocaine, Other, Shrimp extract allergy skin test, Iodinated contrast media, and Shellfish allergy   REVIEW OF SYSTEMS  Negative except as noted here or in the History of  Present Illness.   PHYSICAL EXAMINATION  Initial Vital Signs Blood pressure (!) 107/95, pulse 89, temperature 98.2 F (36.8 C), temperature source Oral, resp. rate 20, height 5\' 1"  (1.549 m), weight 55 kg, SpO2 96 %.  Examination General: Well-developed, well-nourished female in no acute distress; appearance consistent with age of record HENT: normocephalic; atraumatic; good dentition; pharynx normal Eyes: pupils equal, round and reactive to light; extraocular muscles intact Neck: supple Heart: regular rate and rhythm Lungs: clear to auscultation bilaterally; dry cough Abdomen: soft; nondistended; nontender; bowel sounds present Extremities: No deformity; full range of motion; pulses normal Neurologic:  Awake, alert and oriented; motor function intact in all extremities and symmetric; no facial droop Skin: Warm and dry Psychiatric: Normal mood and affect   RESULTS  Summary of this visit's results, reviewed and interpreted by myself:   EKG Interpretation  Date/Time:    Ventricular Rate:    PR Interval:    QRS Duration:   QT Interval:    QTC Calculation:   R Axis:     Text Interpretation:         Laboratory Studies: No results found for this or any previous visit (from the past 24 hour(s)). Imaging Studies: DG Chest 2 View  Result Date: 04/12/2022 CLINICAL DATA:  Shortness of breath EXAM: CHEST - 2 VIEW COMPARISON:  02/08/2022 FINDINGS: The heart size and mediastinal contours are within normal limits. Both lungs are clear. The visualized skeletal structures are unremarkable. IMPRESSION: No active cardiopulmonary disease. Electronically Signed   By: Inez Catalina M.D.   On: 04/12/2022 22:33    ED COURSE and MDM  Nursing notes, initial and subsequent vitals signs, including pulse oximetry, reviewed and interpreted by myself.  Vitals:   04/12/22 2151 04/12/22 2153  BP:  (!) 107/95  Pulse:  89  Resp:  20  Temp:  98.2 F (36.8 C)  TempSrc:  Oral  SpO2:  96%  Weight: 55 kg   Height: 5\' 1"  (1.549 m)    Medications  pantoprazole (PROTONIX) EC tablet 40 mg (has no administration in time range)  albuterol (VENTOLIN HFA) 108 (90 Base) MCG/ACT inhaler 2 puff (has no administration in time range)    Differential diagnosis includes a persistent allergic reaction to her braces although I would expect this to be improving with removal of the offending material.  I would also expect her to be improving from the steroids.  This could represent bronchospasm either related to an allergic reaction or due to seasonal changes.  We will start her on an albuterol inhaler and instruct her in its use.  There is also the possibility of acid reflux causing her symptoms and we will start her on a  PPI as well.  Although she denies a history of GERD it is listed in her medical records.  She is not on an ACE inhibitor so I do not suspect that is the cause of her symptoms.  PROCEDURES  Procedures   ED DIAGNOSES     ICD-10-CM   1. Persistent cough  R05.3     2. Atypical chest pain  R07.89 pantoprazole (PROTONIX) 40 MG tablet         Finlee Concepcion, MD 04/13/22 [phone]

## 2022-04-17 DIAGNOSIS — R051 Acute cough: Secondary | ICD-10-CM | POA: Diagnosis not present

## 2022-05-10 DIAGNOSIS — H2513 Age-related nuclear cataract, bilateral: Secondary | ICD-10-CM | POA: Diagnosis not present

## 2022-05-24 DIAGNOSIS — Z23 Encounter for immunization: Secondary | ICD-10-CM | POA: Diagnosis not present

## 2022-05-24 DIAGNOSIS — M329 Systemic lupus erythematosus, unspecified: Secondary | ICD-10-CM | POA: Diagnosis not present

## 2022-05-24 DIAGNOSIS — Z79899 Other long term (current) drug therapy: Secondary | ICD-10-CM | POA: Diagnosis not present

## 2022-05-24 DIAGNOSIS — M79643 Pain in unspecified hand: Secondary | ICD-10-CM | POA: Diagnosis not present

## 2022-05-24 DIAGNOSIS — I7300 Raynaud's syndrome without gangrene: Secondary | ICD-10-CM | POA: Diagnosis not present

## 2022-05-24 DIAGNOSIS — M1990 Unspecified osteoarthritis, unspecified site: Secondary | ICD-10-CM | POA: Diagnosis not present

## 2022-06-05 ENCOUNTER — Ambulatory Visit: Payer: Medicare Other | Admitting: Family Medicine

## 2022-06-05 DIAGNOSIS — Z79899 Other long term (current) drug therapy: Secondary | ICD-10-CM | POA: Diagnosis not present

## 2022-06-05 NOTE — Progress Notes (Deleted)
HPI: Cheryl White is a 61 y.o. female, who is here today to establish care.  Former PCP: *** Last preventive routine visit: ***  Chronic medical problems: ***  Concerns today: ***  Review of Systems See other pertinent positives and negatives in HPI.  Current Outpatient Medications on File Prior to Visit  Medication Sig Dispense Refill   aspirin 81 MG chewable tablet Chew 1 tablet (81 mg total) by mouth daily. 30 tablet 0   docusate sodium (COLACE) 100 MG capsule Take 1 capsule (100 mg total) 2 (two) times daily by mouth. 30 capsule 0   fluticasone (FLONASE) 50 MCG/ACT nasal spray Place 2 sprays into both nostrils daily. 16 g 6   guaiFENesin-dextromethorphan (ROBITUSSIN DM) 100-10 MG/5ML syrup Take 5 mLs by mouth every 4 (four) hours as needed for cough. 118 mL 0   hydroxychloroquine (PLAQUENIL) 200 MG tablet Take by mouth daily.     hydrOXYzine (ATARAX/VISTARIL) 10 MG tablet Take 1 tablet (10 mg total) by mouth 3 (three) times daily as needed. 30 tablet 0   ibuprofen (ADVIL) 600 MG tablet Take 1 tablet (600 mg total) by mouth every 6 (six) hours as needed. 30 tablet 0   loratadine (CLARITIN) 10 MG tablet Take 1 tablet (10 mg total) by mouth daily. 30 tablet 11   Olopatadine HCl (PATADAY) 0.2 % SOLN Apply 1 drop to eye 2 (two) times daily. 1 Bottle 0   pantoprazole (PROTONIX) 40 MG tablet Take 1 tablet (40 mg total) by mouth daily. 30 tablet 1   potassium chloride (KLOR-CON) 10 MEQ tablet Take 1 tablet (10 mEq total) by mouth daily for 5 days. 5 tablet 0   No current facility-administered medications on file prior to visit.    Past Medical History:  Diagnosis Date   Lupus ([place])    Raynaud's disease    Sjogren's disease ([place])    Allergies  Allergen Reactions   Lidocaine Shortness Of Breath    Allergy actually to Septocaine, not Lidocaine. Palpation and SOB   Other Anaphylaxis   Shrimp Extract Allergy Skin Test Anaphylaxis   Iodinated Contrast Media Other (See Comments)  and Rash   Shellfish Allergy     Family History  Problem Relation Age of Onset   Diabetes Mother    Diabetes Father    Arthritis/Rheumatoid Father    Diabetes Brother    Heart attack Paternal Grandfather     Social History   Socioeconomic History   Marital status: Divorced    Spouse name: Not on file   Number of children: Not on file   Years of education: Not on file   Highest education level: Not on file  Occupational History   Occupation: Cleaning houses  Tobacco Use   Smoking status: Never   Smokeless tobacco: Never  Vaping Use   Vaping Use: Never used  Substance and Sexual Activity   Alcohol use: No   Drug use: No   Sexual activity: Not Currently    Comment: Has been Divorced for 12 years   Other Topics Concern   Not on file  Social History Narrative   Patient divorced 12 years. Patient had three children - two boys and 1 girl in there 58s- 56s. Eldest is  studying education in [place] - getting PHD.    Patient lives on her own in [place].    Social Determinants of Health   Financial Resource Strain: Not on file  Food Insecurity: Not on file  Transportation Needs: Not  on file  Physical Activity: Not on file  Stress: Not on file  Social Connections: Not on file    There were no vitals filed for this visit.  There is no height or weight on file to calculate BMI.  Physical Exam  ASSESSMENT AND PLAN: There are no diagnoses linked to this encounter.  There are no diagnoses linked to this encounter.  No follow-ups on file.  Cheryl G. [place], MD  [facility]. [place] office.

## 2022-07-21 DIAGNOSIS — H16103 Unspecified superficial keratitis, bilateral: Secondary | ICD-10-CM | POA: Diagnosis not present

## 2022-08-07 DIAGNOSIS — K219 Gastro-esophageal reflux disease without esophagitis: Secondary | ICD-10-CM | POA: Diagnosis not present

## 2022-08-07 DIAGNOSIS — R053 Chronic cough: Secondary | ICD-10-CM | POA: Diagnosis not present

## 2022-08-12 DIAGNOSIS — R053 Chronic cough: Secondary | ICD-10-CM | POA: Diagnosis not present

## 2022-08-12 DIAGNOSIS — J849 Interstitial pulmonary disease, unspecified: Secondary | ICD-10-CM | POA: Diagnosis not present

## 2022-08-12 DIAGNOSIS — R918 Other nonspecific abnormal finding of lung field: Secondary | ICD-10-CM | POA: Diagnosis not present

## 2022-09-07 DIAGNOSIS — R1013 Epigastric pain: Secondary | ICD-10-CM | POA: Diagnosis not present

## 2022-09-07 DIAGNOSIS — K219 Gastro-esophageal reflux disease without esophagitis: Secondary | ICD-10-CM | POA: Diagnosis not present

## 2022-09-18 ENCOUNTER — Encounter: Payer: Self-pay | Admitting: Emergency Medicine

## 2022-09-18 ENCOUNTER — Ambulatory Visit (INDEPENDENT_AMBULATORY_CARE_PROVIDER_SITE_OTHER): Payer: 59 | Admitting: Emergency Medicine

## 2022-09-18 VITALS — BP 116/76 | HR 63 | Temp 97.8°F | Ht 61.0 in | Wt 114.5 lb

## 2022-09-18 DIAGNOSIS — Z7689 Persons encountering health services in other specified circumstances: Secondary | ICD-10-CM | POA: Diagnosis not present

## 2022-09-18 DIAGNOSIS — Z8639 Personal history of other endocrine, nutritional and metabolic disease: Secondary | ICD-10-CM | POA: Diagnosis not present

## 2022-09-18 DIAGNOSIS — I7300 Raynaud's syndrome without gangrene: Secondary | ICD-10-CM

## 2022-09-18 DIAGNOSIS — Z1211 Encounter for screening for malignant neoplasm of colon: Secondary | ICD-10-CM | POA: Diagnosis not present

## 2022-09-18 DIAGNOSIS — M329 Systemic lupus erythematosus, unspecified: Secondary | ICD-10-CM

## 2022-09-18 DIAGNOSIS — K219 Gastro-esophageal reflux disease without esophagitis: Secondary | ICD-10-CM

## 2022-09-18 DIAGNOSIS — Z8739 Personal history of other diseases of the musculoskeletal system and connective tissue: Secondary | ICD-10-CM | POA: Diagnosis not present

## 2022-09-18 LAB — CBC WITH DIFFERENTIAL/PLATELET
Basophils Absolute: 0 10*3/uL (ref 0.0–0.1)
Basophils Relative: 0.6 % (ref 0.0–3.0)
Eosinophils Absolute: 0.1 10*3/uL (ref 0.0–0.7)
Eosinophils Relative: 2 % (ref 0.0–5.0)
HCT: 45.3 % (ref 36.0–46.0)
Hemoglobin: 14.9 g/dL (ref 12.0–15.0)
Lymphocytes Relative: 32.2 % (ref 12.0–46.0)
Lymphs Abs: 1.1 10*3/uL (ref 0.7–4.0)
MCHC: 32.9 g/dL (ref 30.0–36.0)
MCV: 83.1 fl (ref 78.0–100.0)
Monocytes Absolute: 0.3 10*3/uL (ref 0.1–1.0)
Monocytes Relative: 8.7 % (ref 3.0–12.0)
Neutro Abs: 1.9 10*3/uL (ref 1.4–7.7)
Neutrophils Relative %: 56.5 % (ref 43.0–77.0)
Platelets: 185 10*3/uL (ref 150.0–400.0)
RBC: 5.46 Mil/uL — ABNORMAL HIGH (ref 3.87–5.11)
RDW: 12.8 % (ref 11.5–15.5)
WBC: 3.3 10*3/uL — ABNORMAL LOW (ref 4.0–10.5)

## 2022-09-18 LAB — TSH: TSH: 0.92 u[IU]/mL (ref 0.35–5.50)

## 2022-09-18 LAB — COMPREHENSIVE METABOLIC PANEL
ALT: 31 U/L (ref 0–35)
AST: 26 U/L (ref 0–37)
Albumin: 4.5 g/dL (ref 3.5–5.2)
Alkaline Phosphatase: 105 U/L (ref 39–117)
BUN: 15 mg/dL (ref 6–23)
CO2: 29 mEq/L (ref 19–32)
Calcium: 10 mg/dL (ref 8.4–10.5)
Chloride: 105 mEq/L (ref 96–112)
Creatinine, Ser: 0.67 mg/dL (ref 0.40–1.20)
GFR: 94.61 mL/min (ref >60.00)
Glucose, Bld: 82 mg/dL (ref 70–99)
Potassium: 4.2 mEq/L (ref 3.5–5.1)
Sodium: 141 mEq/L (ref 135–145)
Total Bilirubin: 0.4 mg/dL (ref 0.2–1.2)
Total Protein: 7.8 g/dL (ref 6.0–8.3)

## 2022-09-18 LAB — LIPID PANEL
Cholesterol: 177 mg/dL (ref 0–200)
HDL: 48.2 mg/dL (ref >39.00)
LDL Cholesterol: 102 mg/dL — ABNORMAL HIGH (ref 0–99)
NonHDL: 128.97
Total CHOL/HDL Ratio: 4
Triglycerides: 137 mg/dL (ref 0.0–149.0)
VLDL: 27.4 mg/dL (ref 0.0–40.0)

## 2022-09-18 LAB — HEMOGLOBIN A1C: Hgb A1c MFr Bld: 5.1 % (ref 4.6–6.5)

## 2022-09-18 NOTE — Progress Notes (Signed)
Cheryl White 62 y.o.   Chief Complaint  Patient presents with   New Patient (Initial Visit)    No concerns     HISTORY OF PRESENT ILLNESS: This is a 62 y.o. female A1A first visit to this office, here to establish care with me. Has the following chronic medical conditions: 1.  History of lupus.  Sees rheumatologist on a regular basis.  On Plaquenil 2.  History of osteoporosis.  Presently on Fosamax weekly 3.  History of Raynaud's 4.  History of thyroid nodules Non-smoker.  No EtOH use. No other complaints or medical concerns today.  HPI   Prior to Admission medications   Medication Sig Start Date End Date Taking? Authorizing Provider  alendronate (FOSAMAX) 70 MG tablet Take 70 mg by mouth once a week.   Yes [provider]  aspirin 81 MG chewable tablet Chew 1 tablet (81 mg total) by mouth daily. 07/20/21  Yes Drenda Freeze, MD  docusate sodium (COLACE) 100 MG capsule Take 1 capsule (100 mg total) 2 (two) times daily by mouth. 05/25/17  Yes Mikell, Jeani Sow, MD  esomeprazole (NEXIUM) 40 MG capsule Take by mouth. 08/07/22  Yes [provider]  famotidine (PEPCID) 40 MG tablet Take by mouth. 08/07/22  Yes [provider]  fluticasone (FLONASE) 50 MCG/ACT nasal spray Place 2 sprays into both nostrils daily. 11/02/17  Yes Mikell, Jeani Sow, MD  hydroxychloroquine (PLAQUENIL) 200 MG tablet Take by mouth daily.   Yes [provider]  Olopatadine HCl (PATADAY) 0.2 % SOLN Apply 1 drop to eye 2 (two) times daily. 06/08/17  Yes Mikell, Jeani Sow, MD  hydrOXYzine (ATARAX/VISTARIL) 10 MG tablet Take 1 tablet (10 mg total) by mouth 3 (three) times daily as needed. Patient not taking: Reported on 09/18/2022 07/18/17   Tonette Bihari, MD  ibuprofen (ADVIL) 600 MG tablet Take 1 tablet (600 mg total) by mouth every 6 (six) hours as needed. Patient not taking: Reported on 09/18/2022 03/30/22   Tedd Sias, PA  loratadine (CLARITIN) 10 MG tablet  Take 1 tablet (10 mg total) by mouth daily. Patient not taking: Reported on 09/18/2022 11/02/17   Tonette Bihari, MD  potassium chloride (KLOR-CON) 10 MEQ tablet Take 1 tablet (10 mEq total) by mouth daily for 5 days. 08/25/21 08/30/21  Eustaquio Maize, PA-C    Allergies  Allergen Reactions   Lidocaine Shortness Of Breath    Allergy actually to Septocaine, not Lidocaine. Palpation and SOB   Other Anaphylaxis   Shrimp Extract Anaphylaxis   Iodinated Contrast Media Other (See Comments) and Rash   Shellfish Allergy     Patient Active Problem List   Diagnosis Date Noted   Memory difficulty 11/02/2017   Otomycosis 09/20/2017   Panic attacks 07/18/2017   GERD (gastroesophageal reflux disease) 07/03/2017   Allergic rhinitis 06/08/2017   Lupus (systemic lupus erythematosus) ([place]) 05/09/2017   Raynaud disease 05/09/2017   Anxiety state 05/09/2017    Past Medical History:  Diagnosis Date   Lupus ([place])    Raynaud's disease    Sjogren's disease ([place])     Past Surgical History:  Procedure Laterality Date   BREAST SURGERY     CESAREAN SECTION     TUBAL LIGATION      Social History   Socioeconomic History   Marital status: Divorced    Spouse name: Not on file   Number of children: Not on file   Years of education: Not on file   Highest  education level: Not on file  Occupational History   Occupation: Cleaning houses  Tobacco Use   Smoking status: Never   Smokeless tobacco: Never  Vaping Use   Vaping Use: Never used  Substance and Sexual Activity   Alcohol use: No   Drug use: No   Sexual activity: Not Currently    Comment: Has been Divorced for 12 years   Other Topics Concern   Not on file  Social History Narrative   Patient divorced 12 years. Patient had three children - two boys and 1 girl in there 54s- 25s. Eldest is  studying education in [place] - getting PHD.    Patient lives on her own in [place].    Social Determinants of Health   Financial Resource  Strain: Not on file  Food Insecurity: Not on file  Transportation Needs: Not on file  Physical Activity: Not on file  Stress: Not on file  Social Connections: Not on file  Intimate Partner Violence: Not on file    Family History  Problem Relation Age of Onset   Diabetes Mother    Diabetes Father    Arthritis/Rheumatoid Father    Diabetes Brother    Heart attack Paternal Grandfather      Review of Systems  Constitutional: Negative.  Negative for chills and fever.  HENT: Negative.  Negative for congestion and sore throat.   Respiratory: Negative.  Negative for cough and shortness of breath.   Cardiovascular: Negative.  Negative for chest pain and palpitations.  Gastrointestinal:  Negative for abdominal pain, diarrhea, nausea and vomiting.  Genitourinary: Negative.  Negative for dysuria and hematuria.  Skin: Negative.  Negative for rash.  Neurological: Negative.  Negative for dizziness and headaches.  All other systems reviewed and are negative.  Today's Vitals   09/18/22 0848  BP: 116/76  Pulse: 63  Temp: 97.8 F (36.6 C)  TempSrc: Oral  SpO2: 99%  Weight: 114 lb 8 oz (51.9 kg)  Height: '5\' 1"'$  (1.549 m)   Body mass index is 21.63 kg/m.  Physical Exam Vitals reviewed.  Constitutional:      Appearance: Normal appearance.  HENT:     Head: Normocephalic.     Mouth/Throat:     Mouth: Mucous membranes are moist.     Pharynx: Oropharynx is clear.  Eyes:     Extraocular Movements: Extraocular movements intact.     Conjunctiva/sclera: Conjunctivae normal.     Pupils: Pupils are equal, round, and reactive to light.  Cardiovascular:     Rate and Rhythm: Normal rate and regular rhythm.     Pulses: Normal pulses.     Heart sounds: Normal heart sounds.  Pulmonary:     Effort: Pulmonary effort is normal.     Breath sounds: Normal breath sounds.  Musculoskeletal:     Cervical back: No tenderness.     Right lower leg: No edema.     Left lower leg: No edema.   Lymphadenopathy:     Cervical: No cervical adenopathy.  Skin:    General: Skin is warm and dry.  Neurological:     General: No focal deficit present.     Mental Status: She is alert and oriented to person, place, and time.  Psychiatric:        Mood and Affect: Mood normal.        Behavior: Behavior normal.    ASSESSMENT & PLAN: A total of 47 minutes was spent with the patient and counseling/coordination of care regarding preparing  for this visit, review of available medical records, establishing care with me, comprehensive history and physical examination, review of chronic medical conditions under management, review of all medications, education on nutrition, prognosis, documentation, review of health maintenance items, and need for follow-up.  Problem List Items Addressed This Visit       Cardiovascular and Mediastinum   Raynaud disease    Stable and well-controlled.  Follows up with rheumatologist on a regular basis.        Digestive   GERD (gastroesophageal reflux disease)    Stable.  Not taking Pepcid or Nexium due to GI side effects.      Relevant Medications   esomeprazole (NEXIUM) 40 MG capsule   famotidine (PEPCID) 40 MG tablet     Other   Lupus (systemic lupus erythematosus) ([place]) - Primary    Well-controlled without complications. Continue Plaquenil 200 mg daily Follows up with rheumatologist on a regular basis.      Relevant Orders   CBC with Differential/Platelet   Comprehensive metabolic panel   Hemoglobin A1c   Lipid panel   History of thyroid nodule    Last thyroid ultrasound followed by biopsy done about 3 years ago. Recommend to repeat thyroid ultrasound.      Relevant Orders   US THYROID   History of osteoporosis    Stable.  Recently started on Fosamax 70 mg weekly Last DEXA scan done about 2 years ago.  No report available. Recommend to repeat it.      Relevant Orders   HM DEXA SCAN (Completed)   Comprehensive metabolic panel   TSH    Other Visit Diagnoses     Encounter to establish care       Colon cancer screening       Relevant Orders   Ambulatory referral to Gastroenterology      Patient Instructions  Mantenimiento de la salud en [place] Maintenance, Female Adoptar un estilo de vida saludable y recibir atencin preventiva son importantes para promover la salud y Musician. Consulte al mdico sobre: El esquema adecuado para hacerse pruebas y exmenes peridicos. Cosas que puede hacer por su cuenta para prevenir enfermedades y [place] sano. Qu debo saber sobre la dieta, el peso y el ejercicio? Consuma una dieta saludable  Consuma una dieta que incluya muchas verduras, frutas, productos lcteos con bajo contenido de [place] y Advertising account planner. No consuma muchos alimentos ricos en grasas slidas, azcares agregados o sodio. Mantenga un peso saludable El ndice de masa muscular [facility]) se [place] para identificar problemas de [place]. Proporciona una estimacin de la grasa corporal basndose en el peso y la altura. Su mdico puede ayudarle a Radiation protection practitioner [place] y a Scientist, forensic o Theatre manager un peso saludable. Haga ejercicio con regularidad Haga ejercicio con regularidad. Esta es una de las prcticas ms importantes que puede hacer por su salud. La [employer] de los adultos deben seguir estas pautas: Optometrist, al menos, 150 minutos de actividad fsica por semana. El ejercicio debe aumentar la frecuencia cardaca y Nature conservation officer transpirar (ejercicio de intensidad moderada). Hacer ejercicios de fortalecimiento por lo [employer] por semana. Agregue esto a su plan de ejercicio de intensidad moderada. Pase menos tiempo sentada. Incluso la actividad fsica ligera puede ser beneficiosa. Controle sus niveles de colesterol y lpidos en la sangre Comience a realizarse anlisis de lpidos y Research officer, trade union en la sangre a los 21 aos y luego reptalos cada 5 aos. Hgase controlar los niveles de colesterol con mayor frecuencia  si: Sus niveles  de lpidos y colesterol son altos. Es mayor de 92 aos. Presenta un alto riesgo de padecer enfermedades cardacas. Qu debo saber sobre las pruebas de deteccin del cncer? Segn su historia clnica y sus antecedentes familiares, es posible que deba realizarse pruebas de deteccin del cncer en diferentes edades. Esto puede incluir pruebas de deteccin de lo siguiente: Cncer de mama. Cncer de cuello uterino. Cncer colorrectal. Cncer de piel. Cncer de pulmn. Qu debo saber sobre la enfermedad cardaca, la diabetes y la hipertensin arterial? Presin arterial y enfermedad cardaca La hipertensin arterial causa enfermedades cardacas y [place] el riesgo de accidente cerebrovascular. Es ms probable que esto se manifieste en las personas que tienen lecturas de presin arterial alta o tienen sobrepeso. Hgase controlar la presin arterial: Cada 3 a 5 aos si tiene entre 18 y 52 aos. Todos los aos si es mayor de 40 aos. Diabetes Realcese exmenes de deteccin de la diabetes con regularidad. Este anlisis revisa el nivel de azcar en la sangre en [place]. Hgase las pruebas de deteccin: Cada tres aos despus de los 18 aos de edad si tiene un peso normal y un bajo riesgo de padecer diabetes. Con ms frecuencia y a partir de [place] edad inferior si tiene sobrepeso o un alto riesgo de padecer diabetes. Qu debo saber sobre la prevencin de infecciones? Hepatitis B Si tiene un riesgo ms alto de contraer hepatitis B, debe someterse a un examen de deteccin de este virus. Hable con el mdico para averiguar si tiene riesgo de contraer la infeccin por hepatitis B. Hepatitis C Se recomienda el anlisis a: [employer] 1945 y 1965. Todas las personas que tengan un riesgo de haber contrado hepatitis C. Enfermedades de transmisin sexual (ETS) Hgase las pruebas de Programme researcher, broadcasting/film/video de ITS, incluidas la gonorrea y la clamidia, si: Es sexualmente activa y es menor de  69 aos. Es mayor de 63 aos, y Investment banker, operational informa que corre riesgo de tener este tipo de infecciones. La actividad sexual ha cambiado desde que le hicieron la ltima prueba de deteccin y tiene un riesgo mayor de Best boy clamidia o Radio broadcast assistant. Pregntele al mdico si usted tiene riesgo. Pregntele al mdico si usted tiene un alto riesgo de Museum/gallery curator VIH. El mdico tambin puede recomendarle un medicamento recetado para ayudar a evitar la infeccin por el VIH. Si elige tomar medicamentos para prevenir el VIH, primero debe [employer] de deteccin del VIH. Luego debe hacerse anlisis cada 3 meses mientras est tomando los medicamentos. Embarazo Si est por dejar de Librarian, academic (fase premenopusica) y usted puede quedar [place], busque asesoramiento antes de [place]. Tome de 400 a 800 microgramos (mcg) de cido [employer] si [place]. Pida mtodos de control de la natalidad (anticonceptivos) si desea evitar un embarazo no deseado. Osteoporosis y [place] La osteoporosis es una enfermedad en la que los huesos pierden los minerales y la fuerza por el avance de la edad. El resultado pueden ser fracturas en los [place]. Si tiene 43 aos o ms, o si est en riesgo de sufrir osteoporosis y fracturas, pregunte a su mdico si debe: Hacerse pruebas de deteccin de prdida sea. Tomar un suplemento de calcio o de vitamina D para reducir el riesgo de fracturas. Recibir terapia de reemplazo hormonal (TRH) para tratar los sntomas de la menopausia. Siga estas indicaciones en su casa: Consumo de alcohol No beba alcohol si: Su mdico le indica no hacerlo. Est embarazada, puede estar embarazada o est tratando [place]  embarazada. Si bebe alcohol: Limite la cantidad que bebe a lo siguiente: De 0 a 1 bebida por da. Sepa cunta cantidad de alcohol hay en las bebidas que toma. En los [place], una medida equivale a una botella de cerveza de 12 oz (355 ml), un vaso de  vino de 5 oz (148 ml) o un vaso de una bebida alcohlica de alta graduacin de 1 oz (44 ml). Estilo de vida No consuma ningn producto que contenga nicotina o tabaco. Estos productos incluyen cigarrillos, tabaco para Higher education careers adviser y aparatos de vapeo, como los Psychologist, sport and exercise. Si necesita ayuda para dejar de consumir estos productos, consulte al mdico. No consuma drogas. No comparta agujas. Solicite ayuda a su mdico si necesita apoyo o informacin para abandonar las drogas. Indicaciones generales Realcese los estudios de rutina de la salud, dentales y de Public librarian. [place]. Infrmele a su mdico si: Se siente deprimida con frecuencia. Alguna vez ha sido vctima de [place] o no se siente seguro en su casa. Resumen Adoptar un estilo de vida saludable y recibir atencin preventiva son importantes para promover la salud y Musician. Siga las instrucciones del mdico acerca de una dieta saludable, el ejercicio y la realizacin de pruebas o exmenes para Engineer, building services. Siga las instrucciones del mdico con respecto al control del colesterol y la presin arterial. Esta informacin no tiene Marine scientist el consejo del mdico. Asegrese de hacerle al mdico cualquier pregunta que tenga. Document Revised: 12/02/2020 Document Reviewed: 12/02/2020 Elsevier Patient Education  [employer], MD [facility]

## 2022-09-18 NOTE — Assessment & Plan Note (Signed)
Stable.  Not taking Pepcid or Nexium due to GI side effects.

## 2022-09-18 NOTE — Assessment & Plan Note (Signed)
Last thyroid ultrasound followed by biopsy done about 3 years ago. Recommend to repeat thyroid ultrasound.

## 2022-09-18 NOTE — Assessment & Plan Note (Signed)
Well-controlled without complications. Continue Plaquenil 200 mg daily Follows up with rheumatologist on a regular basis.

## 2022-09-18 NOTE — Assessment & Plan Note (Signed)
Stable and well-controlled.  Follows up with rheumatologist on a regular basis.

## 2022-09-18 NOTE — Patient Instructions (Signed)

## 2022-09-18 NOTE — Assessment & Plan Note (Signed)
Stable.  Recently started on Fosamax 70 mg weekly Last DEXA scan done about 2 years ago.  No report available. Recommend to repeat it.

## 2022-09-27 DIAGNOSIS — M329 Systemic lupus erythematosus, unspecified: Secondary | ICD-10-CM | POA: Diagnosis not present

## 2022-09-27 DIAGNOSIS — I7300 Raynaud's syndrome without gangrene: Secondary | ICD-10-CM | POA: Diagnosis not present

## 2022-09-27 DIAGNOSIS — M1990 Unspecified osteoarthritis, unspecified site: Secondary | ICD-10-CM | POA: Diagnosis not present

## 2022-09-27 DIAGNOSIS — R202 Paresthesia of skin: Secondary | ICD-10-CM | POA: Diagnosis not present

## 2022-09-27 DIAGNOSIS — M79643 Pain in unspecified hand: Secondary | ICD-10-CM | POA: Diagnosis not present

## 2022-09-29 DIAGNOSIS — R053 Chronic cough: Secondary | ICD-10-CM | POA: Diagnosis not present

## 2022-09-29 DIAGNOSIS — R918 Other nonspecific abnormal finding of lung field: Secondary | ICD-10-CM | POA: Diagnosis not present

## 2022-09-29 DIAGNOSIS — J984 Other disorders of lung: Secondary | ICD-10-CM | POA: Diagnosis not present

## 2022-10-02 DIAGNOSIS — Z1211 Encounter for screening for malignant neoplasm of colon: Secondary | ICD-10-CM | POA: Diagnosis not present

## 2022-10-02 DIAGNOSIS — R140 Abdominal distension (gaseous): Secondary | ICD-10-CM | POA: Diagnosis not present

## 2022-10-02 DIAGNOSIS — K219 Gastro-esophageal reflux disease without esophagitis: Secondary | ICD-10-CM | POA: Diagnosis not present

## 2022-10-06 ENCOUNTER — Ambulatory Visit
Admission: RE | Admit: 2022-10-06 | Discharge: 2022-10-06 | Disposition: A | Discharge status: Home / Self Care | Payer: 59 | Source: Ambulatory Visit | Attending: Emergency Medicine | Admitting: Emergency Medicine

## 2022-10-06 DIAGNOSIS — E042 Nontoxic multinodular goiter: Secondary | ICD-10-CM | POA: Diagnosis not present

## 2022-10-06 DIAGNOSIS — Z8639 Personal history of other endocrine, nutritional and metabolic disease: Secondary | ICD-10-CM

## 2022-10-17 ENCOUNTER — Ambulatory Visit (INDEPENDENT_AMBULATORY_CARE_PROVIDER_SITE_OTHER): Payer: 59 | Admitting: Emergency Medicine

## 2022-10-17 ENCOUNTER — Encounter: Payer: Self-pay | Admitting: Emergency Medicine

## 2022-10-17 VITALS — BP 110/64 | HR 65 | Temp 98.2°F | Ht 61.0 in | Wt 111.0 lb

## 2022-10-17 DIAGNOSIS — M329 Systemic lupus erythematosus, unspecified: Secondary | ICD-10-CM | POA: Diagnosis not present

## 2022-10-17 DIAGNOSIS — F411 Generalized anxiety disorder: Secondary | ICD-10-CM

## 2022-10-17 MED ORDER — DULOXETINE HCL 30 MG PO CPEP: 30.0000 mg | ORAL_CAPSULE | Freq: Every day | ORAL | 3 refills | Status: DC

## 2022-10-17 NOTE — Patient Instructions (Signed)
Trastorno de ansiedad generalizada, en adultos Generalized Anxiety Disorder, Adult El trastorno de ansiedad generalizada (TAG) es una afeccin de salud mental. A diferencia de las preocupaciones normales, la ansiedad relacionada con el TAG no se desencadena por un acontecimiento especfico. Estas preocupaciones no desaparecen ni mejoran con el tiempo. EL TAG interfiere con las relaciones, el trabajo y los estudios. Los sntomas del TAG pueden variar de leves a graves. Las personas con TAG grave pueden tener intensas oleadas de ansiedad con sntomas fsicos que son similares a las crisis de angustia. Cules son las causas? Se desconoce la causa exacta del TAG, pero se cree que influyen los siguientes factores: Diferencias en las sustancias qumicas naturales del cerebro. Genes que se transmiten de padres a hijos. Diferencias en la forma en que se perciben las amenazas. Desarrollo y estrs durante la infancia. La personalidad. Qu incrementa el riesgo? Los siguientes factores pueden hacer que sea ms propenso a desarrollar esta afeccin: Ser mujer. Tener antecedentes familiares de trastornos de ansiedad. Ser muy tmido. Experimentar acontecimientos muy estresantes en la vida, como la muerte de un ser querido. Tener un ambiente familiar muy estresante. Cules son los signos o sntomas? Con frecuencia, las personas que padecen el TAG se preocupan excesivamente por muchas cosas en la vida, tales como su salud y su familia. Otros sntomas son: Sntomas mentales y emocionales: Preocupacin excesiva acerca de los desastres naturales. [place] a llegar tarde. Dificultad para concentrarse. Temor de que otras personas juzguen su desempeo. Sntomas fsicos: Fatiga. Dolores de cabeza, tensin muscular, contracciones musculares, temblores o sensacin de tambalearse. Sensacin de que el corazn late muy rpido. Sentir falta de aire o como que no se puede respirar profundamente. Problemas para  conciliar el sueo o para seguir durmiendo, o sensacin de agitacin. Sudoracin. Nuseas, diarrea o sndrome de colon irritable (SCI). Sntomas de la conducta: Tener estados de nimo cambiantes o irritabilidad. Evitar situaciones nuevas. Evitar a las personas. Dificultad extrema para tomar decisiones. Cmo se diagnostica? Esta afeccin se diagnostica en funcin de los sntomas y los antecedentes mdicos. Adems, se le realizar un examen fsico. El mdico puede hacerle algunas pruebas para descartar que sus sntomas tengan otras causas. Para recibir un diagnstico del TAG, una persona debe tener ansiedad que: Est fuera de su control. Afecte distintos aspectos de su vida, como el trabajo y las relaciones. Cause angustia que le impida participar en sus actividades habituales. Incluya al menos tres sntomas del TAG, tales como desasosiego, fatiga, dificultad para concentrarse, irritabilidad, tensin muscular o problemas para dormir. Antes de que su mdico pueda confirmar el diagnstico de TAG, estos sntomas deben estar presentes ms das de los que no lo estn y deben tener una duracin de seis meses o ms. Cmo se trata? El tratamiento de esta afeccin puede incluir: Medicamentos. Por lo general, los medicamentos antidepresivos se recetan para un control diario a largo plazo. Se pueden agregar medicamentos para la ansiedad en casos graves, especialmente cuando ocurren crisis de angustia. Psicoterapia (psicoanlisis). Determinados tipos de psicoterapia pueden ser tiles para tratar el TAG al brindar apoyo, educacin y orientacin. Entre las opciones se incluyen las siguientes: Terapia cognitivo conductual (TCC). Las personas aprenden habilidades para afrontar situaciones y tcnicas para poder calmarse para aliviar sus sntomas fsicos. Aprenden a identificar comportamientos y pensamientos no realistas y a reemplazarlos por comportamientos y pensamientos ms adecuados. Terapia de aceptacin y  compromiso (acceptance and commitment therapy, ACT). Este tratamiento ensea a las personas a ser conscientes como una forma de lidiar con pensamientos y   sentimientos no deseados. Biorretroalimentacin. Este proceso lo capacita para controlar la respuesta del cuerpo (respuesta psicolgica) a travs de tcnicas de respiracin y mtodos de relajacin. Usted trabajar con un terapeuta mientras se usan mquinas para controlar sus sntomas fsicos. Tcnicas para controlar el estrs. Estas incluyen yoga, meditacin y ejercicio. Un especialista en salud mental puede ayudar a determinar qu tratamiento es el mejor para usted. Algunas personas pueden mejorar con solo un tipo de terapia. Sin embargo, otras personas requieren una combinacin de terapias. Siga estas instrucciones en su casa: Estilo de vida Mantenga horarios y una rutina uniforme. Prevea las situaciones estresantes. Elabore un plan y reserve tiempo adicional para trabajar con su plan. Practique tcnicas de control del estrs o tcnicas para calmarse que su terapeuta o su mdico le haya enseado. Haga actividad fsica con frecuencia y pase tiempo al aire libre. Siga una dieta saludable que incluya abundantes frutas, verduras, cereales integrales, productos lcteos descremados y protenas magras. No consuma muchos alimentos con alto contenido de grasas, azcar agregado o sal (sodio). Beba abundante agua. Evite tomar alcohol. El alcohol puede aumentar la ansiedad. Evite el consumo de cafena y ciertos medicamentos de venta libre contra el resfro. Estos podran hacerlo sentir peor. Pregntele al farmacutico qu medicamentos no debera tomar. Instrucciones generales Use los medicamentos de venta libre y los recetados solamente como se lo haya indicado el mdico. Comprenda que es probable que tenga retrocesos. Acepte esto y sea amable con usted mismo mientras contina cuidndose mejor. Prevea las situaciones estresantes. Elabore un plan y reserve  tiempo adicional para trabajar con su plan. Reconozca y acepte sus logros, aunque le parezcan pequeos. Pase tiempo con personas que se preocupan por usted. Concurra a todas las visitas de seguimiento. Esto es importante. Dnde obtener ms informacin National Institute of Mental Health (Instituto Nacional de la Salud Mental): www.nimh.nih.gov Substance Abuse and Mental Health Services (Servicios por Abuso de Sustancias y Salud Mental): www.samhsa.gov Comunquese con un mdico si: Los sntomas no mejoran. Los sntomas empeoran. Tiene signos de depresin tales como: Un estado de nimo constantemente triste o irritable. Ya no disfruta de actividades que le solan causar placer. Cambios en el peso o en sus hbitos de alimentacin. Cambios en los hbitos de sueo. Solicite ayuda de inmediato si: Tiene pensamientos acerca de lastimarse a usted mismo o a otras personas. Si alguna vez siente que puede lastimarse o lastimar a otras personas, o tiene pensamientos de poner fin a su vida, busque ayuda de inmediato. Dirjase al servicio de urgencias ms cercano o: Comunquese con el servicio de emergencias de su localidad (911 en los [place]). Llame a una lnea de asistencia al suicida y atencin en crisis como National Suicide Prevention Lifeline (Lnea Nacional de Prevencin del Suicidio) al 1-800-273-8255. Est disponible las 24 horas del da en los [place] Enve un mensaje de texto a la lnea para casos de crisis al [id number] (en los [place] U[place].). Resumen El trastorno de ansiedad generalizada (TAG) es una afeccin de salud mental que implica preocupacin no desencadenada por un acontecimiento especfico. Con frecuencia, las personas que padecen el TAG se preocupan excesivamente por muchas cosas en la vida, tales como su salud y su familia. El TAG puede causar sntomas tales como agitacin, dificultad para concentrarse, problemas para dormir, sudoracin frecuente, nuseas, diarrea, dolores de cabeza y  temblores o contracciones musculares. Un especialista en salud mental puede ayudar a determinar qu tratamiento es el mejor para usted. Algunas personas pueden mejorar con solo un tipo de terapia.   Sin embargo, otras personas requieren una combinacin de terapias. Esta informacin no tiene como fin reemplazar el consejo del mdico. Asegrese de hacerle al mdico cualquier pregunta que tenga. Document Revised: 11/04/2020 Document Reviewed: 11/04/2020 Elsevier Patient Education  2023 Elsevier Inc.  

## 2022-10-17 NOTE — Progress Notes (Signed)
Cheryl White 62 y.o.   Chief Complaint  Patient presents with   Anxiety    Patient states the anxiety is getting worse     HISTORY OF PRESENT ILLNESS: This is a 62 y.o. female A1A from [place] complaining of generalized anxiety And frequent panic attacks History of well-controlled lupus No other complaints or medical concerns today.  Anxiety Symptoms include nervous/anxious behavior. Patient reports no chest pain, dizziness, nausea, palpitations or shortness of breath.       Prior to Admission medications   Medication Sig Start Date End Date Taking? Authorizing Provider  hydroxychloroquine (PLAQUENIL) 200 MG tablet Take by mouth daily.   Yes [provider]  Olopatadine HCl (PATADAY) 0.2 % SOLN Apply 1 drop to eye 2 (two) times daily. 06/08/17  Yes Mikell, Antionette Poles, MD  alendronate (FOSAMAX) 70 MG tablet Take 70 mg by mouth once a week. Patient not taking: Reported on 10/17/2022    [provider]  aspirin 81 MG chewable tablet Chew 1 tablet (81 mg total) by mouth daily. Patient not taking: Reported on 10/17/2022 07/20/21   Charlynne Pander, MD  esomeprazole (NEXIUM) 40 MG capsule Take by mouth. Patient not taking: Reported on 10/17/2022 08/07/22   [provider]  famotidine (PEPCID) 40 MG tablet Take by mouth. Patient not taking: Reported on 10/17/2022 08/07/22   [provider]  fluticasone (FLONASE) 50 MCG/ACT nasal spray Place 2 sprays into both nostrils daily. 11/02/17   Mikell, Antionette Poles, MD  hydrOXYzine (ATARAX/VISTARIL) 10 MG tablet Take 1 tablet (10 mg total) by mouth 3 (three) times daily as needed. Patient not taking: Reported on 09/18/2022 07/18/17   Berton Bon, MD  ibuprofen (ADVIL) 600 MG tablet Take 1 tablet (600 mg total) by mouth every 6 (six) hours as needed. Patient not taking: Reported on 09/18/2022 03/30/22   Gailen Shelter, PA  loratadine (CLARITIN) 10 MG tablet Take 1 tablet (10 mg total) by mouth daily. Patient  not taking: Reported on 09/18/2022 11/02/17   Berton Bon, MD  potassium chloride (KLOR-CON) 10 MEQ tablet Take 1 tablet (10 mEq total) by mouth daily for 5 days. 08/25/21 08/30/21  Tanda Rockers, PA-C    Allergies  Allergen Reactions   Lidocaine Shortness Of Breath    Allergy actually to Septocaine, not Lidocaine. Palpation and SOB   Other Anaphylaxis   Shrimp Extract Anaphylaxis   Iodinated Contrast Media Other (See Comments) and Rash   Shellfish Allergy     Patient Active Problem List   Diagnosis Date Noted   History of thyroid nodule 09/18/2022   History of osteoporosis 09/18/2022   Memory difficulty 11/02/2017   Otomycosis 09/20/2017   Panic attacks 07/18/2017   GERD (gastroesophageal reflux disease) 07/03/2017   Allergic rhinitis 06/08/2017   Lupus (systemic lupus erythematosus) 05/09/2017   Raynaud disease 05/09/2017   Anxiety state 05/09/2017    Past Medical History:  Diagnosis Date   Lupus (HCC)    Raynaud's disease    Sjogren's disease (HCC)     Past Surgical History:  Procedure Laterality Date   BREAST SURGERY     CESAREAN SECTION     TUBAL LIGATION      Social History   Socioeconomic History   Marital status: Divorced    Spouse name: Not on file   Number of children: Not on file   Years of education: Not on file   Highest education level: Not on file  Occupational History   Occupation: Education officer, environmental  houses  Tobacco Use   Smoking status: Never   Smokeless tobacco: Never  Vaping Use   Vaping Use: Never used  Substance and Sexual Activity   Alcohol use: No   Drug use: No   Sexual activity: Not Currently    Comment: Has been Divorced for 12 years   Other Topics Concern   Not on file  Social History Narrative   Patient divorced 12 years. Patient had three children - two boys and 1 girl in there 42s- 30s. Eldest is  studying education in [place] - getting PHD.    Patient lives on her own in [place].    Social Determinants of Health    Financial Resource Strain: Not on file  Food Insecurity: Not on file  Transportation Needs: Not on file  Physical Activity: Not on file  Stress: Not on file  Social Connections: Not on file  Intimate Partner Violence: Not on file    Family History  Problem Relation Age of Onset   Diabetes Mother    Diabetes Father    Arthritis/Rheumatoid Father    Diabetes Brother    Heart attack Paternal Grandfather      Review of Systems  Constitutional: Negative.  Negative for chills and fever.  HENT: Negative.  Negative for congestion and sore throat.   Respiratory: Negative.  Negative for cough and shortness of breath.   Cardiovascular: Negative.  Negative for chest pain and palpitations.  Gastrointestinal:  Negative for abdominal pain, diarrhea, nausea and vomiting.  Skin: Negative.  Negative for rash.  Neurological:  Negative for dizziness and headaches.  Psychiatric/Behavioral:  The patient is nervous/anxious.   All other systems reviewed and are negative.   Vitals:   10/17/22 0813  BP: 110/64  Pulse: 65  Temp: 98.2 F (36.8 C)  SpO2: 97%    Physical Exam Vitals reviewed.  Constitutional:      Appearance: Normal appearance.  HENT:     Head: Normocephalic.  Eyes:     Extraocular Movements: Extraocular movements intact.     Pupils: Pupils are equal, round, and reactive to light.  Cardiovascular:     Rate and Rhythm: Normal rate.  Pulmonary:     Effort: Pulmonary effort is normal.  Musculoskeletal:     Cervical back: No tenderness.  Lymphadenopathy:     Cervical: No cervical adenopathy.  Skin:    General: Skin is warm and dry.  Neurological:     Mental Status: She is alert and oriented to person, place, and time.  Psychiatric:        Mood and Affect: Mood normal.        Behavior: Behavior normal.      ASSESSMENT & PLAN: A total of 44 minutes was spent with the patient and counseling/coordination of care regarding preparing for this visit, review of most  recent office visit notes, review of chronic medical conditions under management, review of all medications, diagnosis of generalized anxiety disorder and management including starting daily Cymbalta 30 mg, review of most recent blood work results, prognosis, documentation, and need for follow-up.  Problem List Items Addressed This Visit       Other   Lupus (systemic lupus erythematosus)    Stable.  No complications Recent blood work results from last month reviewed with patient No diabetes, no anemia, normal TSH, normal kidney function No abnormalities detected.      Generalized anxiety disorder - Primary    Active and affecting quality of life with frequent panic attacks  Recommend to start Cymbalta 30 mg daily Stress management discussed      Relevant Medications   DULoxetine (CYMBALTA) 30 MG capsule   Patient Instructions  Trastorno de ansiedad generalizada, en adultos Generalized Anxiety Disorder, Adult El trastorno de ansiedad generalizada (TAG) es una afeccin de salud mental. A diferencia de las preocupaciones normales, la ansiedad relacionada con el TAG no se desencadena por un acontecimiento especfico. Estas preocupaciones no desaparecen ni mejoran con el tiempo. EL TAG interfiere con las relaciones, el trabajo y [place]. Los sntomas del TAG pueden variar de leves a graves. Las personas con TAG grave pueden tener intensas oleadas de ansiedad con sntomas fsicos que son similares a las crisis de [place]. Cules son las causas? Se desconoce la causa exacta del TAG, pero se cree que influyen los siguientes factores: Diferencias en las sustancias qumicas naturales del cerebro. Genes que se transmiten de padres a hijos. Diferencias en la forma en que se perciben las amenazas. Desarrollo y Librarian, academic durante la infancia. La personalidad. Qu incrementa el riesgo? Los siguientes factores pueden hacer que sea ms propenso a Aeronautical engineer afeccin: Ser mujer. Tener  antecedentes familiares de trastornos de ansiedad. Ser muy tmido. Experimentar acontecimientos muy estresantes en la vida, como la muerte de un ser querido. Tener un Psychologist, occupational. Cules son los signos o sntomas? Con frecuencia, las personas que padecen el TAG se preocupan excesivamente por muchas cosas en la vida, tales como su salud y [place]. Otros sntomas son: Management consultant y emocionales: Preocupacin excesiva acerca de los desastres naturales. [place] a llegar tarde. Dificultad para concentrarse. Temor de que otras personas juzguen su desempeo. Sntomas fsicos: Fatiga. Dolores de cabeza, tensin muscular, contracciones musculares, temblores o sensacin de tambalearse. Sensacin de que el corazn late [place] rpido. Sentir falta de aire o como que no se puede respirar profundamente. Problemas para conciliar el sueo o para seguir durmiendo, o sensacin de Designer, jewellery. Sudoracin. Nuseas, diarrea o sndrome de colon irritable (SCI). Sntomas de la conducta: Tener estados de nimo cambiantes o irritabilidad. Evitar situaciones nuevas. Evitar a Magazine features editor. Dificultad extrema para tomar decisiones. Cmo se diagnostica? Esta afeccin se diagnostica en funcin de los sntomas y los antecedentes mdicos. Adems, se le Medical sales representative examen fsico. El mdico puede hacerle algunas pruebas para descartar que sus sntomas tengan otras causas. Para recibir un diagnstico del TAG, una persona debe tener ansiedad que: Est fuera de su control. Afecte distintos aspectos de su vida, como el trabajo y [employer]. Cause angustia que le impida participar en sus actividades habituales. Incluya al menos tres sntomas del TAG, tales como desasosiego, fatiga, dificultad para concentrarse, irritabilidad, tensin muscular o problemas para dormir. Antes de que su mdico pueda confirmar el diagnstico de TAG, estos sntomas deben estar presentes ms [employer] no lo estn  y deben tener una duracin de seis meses o ms. Cmo se trata? El tratamiento de esta afeccin puede incluir: Medicamentos. Por lo general, los medicamentos antidepresivos se recetan para un control diario a Air cabin crew. Se pueden agregar medicamentos para la ansiedad en casos graves, especialmente cuando ocurren crisis de [place]. Psicoterapia (psicoanlisis). Determinados tipos de psicoterapia pueden ser tiles para tratar el TAG al brindar apoyo, educacin y orientacin. Entre las opciones se incluyen las siguientes: Terapia cognitivo conductual (TCC). Las personas aprenden habilidades para afrontar situaciones y tcnicas para poder calmarse para aliviar sus sntomas fsicos. Aprenden a identificar comportamientos y pensamientos no realistas y a Teacher, music por comportamientos y pensamientos  ms adecuados. Terapia de aceptacin y compromiso (acceptance and commitment therapy, ACT). Este tratamiento ensea a las personas a ser conscientes como una forma de lidiar con pensamientos y sentimientos no deseados. Biorretroalimentacin. Este proceso lo capacita para Scientist, physiological respuesta del cuerpo Publishing copy) a travs de tcnicas de respiracin y mtodos de relajacin. Usted trabajar con un terapeuta mientras se usan mquinas para controlar sus sntomas fsicos. Tcnicas para Charity fundraiser. Estas incluyen yoga, meditacin y ejercicio. Un especialista en salud mental puede ayudar a determinar qu tratamiento es el mejor para usted. Algunas Naval architect con solo un tipo de [place]. Sin embargo, Economist requieren una combinacin de terapias. Siga estas instrucciones en su casa: Estilo de vida Mantenga horarios y [place] rutina uniforme. Prevea las situaciones estresantes. Stevan Born un plan y reserve tiempo adicional para trabajar con su plan. Practique tcnicas de control del estrs o tcnicas para calmarse que su terapeuta o su mdico le haya enseado. Haga actividad  fsica con frecuencia y pase tiempo al [employer]. Siga una dieta saludable que incluya abundantes frutas, verduras, cereales integrales, productos lcteos descremados y protenas magras. No consuma muchos alimentos con alto contenido de grasas, azcar agregado o sal (sodio). Beba abundante agua. Evite tomar alcohol. El alcohol puede aumentar la ansiedad. Evite el consumo de cafena y ciertos medicamentos de venta libre contra el resfro. Estos podran Optician, dispensing. Pregntele al farmacutico qu medicamentos no debera tomar. Instrucciones generales Use los medicamentos de venta libre y los recetados solamente como se lo haya indicado el mdico. Comprenda que es probable que tenga retrocesos. Acepte esto y sea amable con usted mismo mientras contina cuidndose mejor. Prevea las situaciones estresantes. Stevan Born un plan y reserve tiempo adicional para trabajar con su plan. Reconozca y acepte sus logros, aunque le parezcan pequeos. Pase tiempo con personas que se preocupan por usted. Concurra a todas las visitas de seguimiento. Esto es importante. Dnde obtener ms informacin [employer] of Mental Health (Instituto Nacional de la Salud Mental): http://www.maynard.net/ Substance Abuse and Mental Health Services (Servicios por Abuso de Sustancias y Salud Mental): SkateOasis.com.pt Comunquese con un mdico si: Los sntomas no mejoran. Los sntomas empeoran. Tiene signos de depresin tales como: Un estado de nimo constantemente triste o irritable. Ya no disfruta de Industrial/product designer. Cambios en el peso o en sus hbitos de alimentacin. Cambios en los hbitos de sueo. Solicite ayuda de inmediato si: Tiene pensamientos acerca de lastimarse a usted mismo o a Economist. Si alguna vez siente que puede lastimarse o Physicist, medical a Economist, o tiene pensamientos de poner fin a su vida, busque ayuda de inmediato. Dirjase al servicio de urgencias ms cercano  o: Comunquese con el servicio de emergencias de su localidad (911 en los [place]). Llame a una lnea de asistencia al suicida y atencin en crisis como National Suicide Prevention Lifeline (Lnea Nacional de Prevencin del Suicidio) al 630 456 3149. Est disponible las 24 horas del da en los [place] Enve un mensaje de texto a la lnea para casos de crisis al [phone] (en los [place] UU.). Resumen El trastorno de ansiedad generalizada (TAG) es una afeccin de salud mental que implica preocupacin no desencadenada por un acontecimiento especfico. Con frecuencia, las personas que padecen el TAG se preocupan excesivamente por muchas cosas en la vida, tales como su salud y [place]. El TAG puede causar sntomas tales como agitacin, dificultad para concentrarse, problemas para dormir, sudoracin frecuente, nuseas, diarrea, dolores de [place] y temblores  o contracciones musculares. Un especialista en salud mental puede ayudar a determinar qu tratamiento es el mejor para usted. Algunas Naval architectpersonas pueden mejorar con solo un tipo de Grove[place]terapia. Sin embargo, Economistotras personas requieren una combinacin de terapias. Esta informacin no tiene Theme park managercomo fin reemplazar el consejo del mdico. Asegrese de hacerle al mdico cualquier pregunta que tenga. Document Revised: 11/04/2020 Document Reviewed: 11/04/2020 Elsevier Patient Education  2023 Elsevier Inc.       Edwina BarthMiguel Kmarion Rawl, MD  [facility]

## 2022-10-17 NOTE — Assessment & Plan Note (Signed)
Active and affecting quality of life with frequent panic attacks Recommend to start Cymbalta 30 mg daily Stress management discussed

## 2022-10-17 NOTE — Assessment & Plan Note (Signed)
Stable.  No complications Recent blood work results from last month reviewed with patient No diabetes, no anemia, normal TSH, normal kidney function No abnormalities detected.

## 2022-10-19 ENCOUNTER — Telehealth: Payer: Self-pay | Admitting: Emergency Medicine

## 2022-10-19 NOTE — Telephone Encounter (Signed)
Cheryl White - patient's daughter called and said that the duloxetine is making her mother nauseous - she would like to know if there is something else that can be sent in  - [facility] - please call daughter back and let her know.  Number:  [phone]

## 2022-10-20 ENCOUNTER — Other Ambulatory Visit: Payer: Self-pay | Admitting: Internal Medicine

## 2022-10-20 DIAGNOSIS — F411 Generalized anxiety disorder: Secondary | ICD-10-CM

## 2022-10-20 MED ORDER — MIRTAZAPINE 7.5 MG PO TABS: 7.5000 mg | ORAL_TABLET | Freq: Every day | ORAL | 0 refills | Status: DC

## 2022-10-20 NOTE — Telephone Encounter (Signed)
Called patient daughter to inform her that a new medication was sent to the pharmacy for the patient

## 2022-10-24 ENCOUNTER — Ambulatory Visit
Admission: EM | Admit: 2022-10-24 | Discharge: 2022-10-24 | Disposition: A | Discharge status: Home / Self Care | Payer: 59 | Attending: Nurse Practitioner | Admitting: Nurse Practitioner

## 2022-10-24 ENCOUNTER — Ambulatory Visit: Admit: 2022-10-24 | Payer: 59

## 2022-10-24 DIAGNOSIS — A084 Viral intestinal infection, unspecified: Secondary | ICD-10-CM

## 2022-10-24 LAB — POCT URINALYSIS DIP (MANUAL ENTRY)
Bilirubin, UA: NEGATIVE
Glucose, UA: NEGATIVE mg/dL
Ketones, POC UA: NEGATIVE mg/dL
Leukocytes, UA: NEGATIVE
Nitrite, UA: NEGATIVE
Protein Ur, POC: NEGATIVE mg/dL
Spec Grav, UA: 1.02 (ref 1.010–1.025)
Urobilinogen, UA: 0.2 E.U./dL
pH, UA: 5.5 (ref 5.0–8.0)

## 2022-10-24 MED ORDER — ONDANSETRON 4 MG PO TBDP: 4.0000 mg | ORAL_TABLET | Freq: Three times a day (TID) | ORAL | 0 refills | Status: DC | PRN

## 2022-10-24 NOTE — Discharge Instructions (Signed)
Zofran as needed for nausea or vomiting Rest and fluids.  Increase your fluid intake with Gatorade, Powerade, Pedialyte, water Bland diet and advance as you tolerate Please follow-up with your PCP if you are not improving Please go to the ER for any worsening symptoms

## 2022-10-24 NOTE — ED Triage Notes (Addendum)
Pt c/o mid abd pain and weakness. Pt had diarrhea and emesis on Sunday after being exposed to a GI bug. Pt reports n/v/d has improved, but has had residual abd pain and weakness. Also c/o HA and dizziness. Has been able to tolerate PO intake.

## 2022-10-24 NOTE — ED Provider Notes (Signed)
[facility]    CSN: [id number] Arrival date & time: 10/24/22  1907      History   Chief Complaint Chief Complaint  Patient presents with   Emesis   Weakness    HPI Cheryl White is a 62 y.o. female presents for evaluation of nausea and vomiting diarrhea.  Spanish interpretation line used.  Patient reports 2 days of nausea vomiting and diarrhea that are nonbloody.  Does report some low-grade fevers but denies URI symptoms.  States nausea has improved but she continues to have some abdominal pain.  No bloating.  She is able to drink fluids but has a decreased appetite.  She does feel weaker.  No urinary symptoms. She was exposed to the viral stomach flu by family member prior to onset of symptoms.  No history of GI diagnoses such as Crohn's or IBS.  No other concerns at this time.   Emesis Associated symptoms: diarrhea   Weakness Associated symptoms: diarrhea, nausea and vomiting     Past Medical History:  Diagnosis Date   Lupus    Raynaud's disease    Sjogren's disease     Patient Active Problem List   Diagnosis Date Noted   History of thyroid nodule 09/18/2022   History of osteoporosis 09/18/2022   Memory difficulty 11/02/2017   Otomycosis 09/20/2017   Panic attacks 07/18/2017   GERD (gastroesophageal reflux disease) 07/03/2017   Allergic rhinitis 06/08/2017   Lupus (systemic lupus erythematosus) 05/09/2017   Raynaud disease 05/09/2017   Generalized anxiety disorder 05/09/2017    Past Surgical History:  Procedure Laterality Date   BREAST SURGERY     CESAREAN SECTION     TUBAL LIGATION      OB History   No obstetric history on file.      Home Medications    Prior to Admission medications   Medication Sig Start Date End Date Taking? Authorizing Provider  ondansetron (ZOFRAN-ODT) 4 MG disintegrating tablet Take 1 tablet (4 mg total) by mouth every 8 (eight) hours as needed for nausea or vomiting. 10/24/22  Yes Radford Pax, NP  alendronate  (FOSAMAX) 70 MG tablet Take 70 mg by mouth once a week. Patient not taking: Reported on 10/17/2022    [provider]  aspirin 81 MG chewable tablet Chew 1 tablet (81 mg total) by mouth daily. Patient not taking: Reported on 10/17/2022 07/20/21   Charlynne Pander, MD  esomeprazole (NEXIUM) 40 MG capsule Take by mouth. Patient not taking: Reported on 10/17/2022 08/07/22   [provider]  famotidine (PEPCID) 40 MG tablet Take by mouth. Patient not taking: Reported on 10/17/2022 08/07/22   [provider]  fluticasone (FLONASE) 50 MCG/ACT nasal spray Place 2 sprays into both nostrils daily. 11/02/17   Mikell, Antionette Poles, MD  hydroxychloroquine (PLAQUENIL) 200 MG tablet Take by mouth daily.    [provider]  hydrOXYzine (ATARAX/VISTARIL) 10 MG tablet Take 1 tablet (10 mg total) by mouth 3 (three) times daily as needed. Patient not taking: Reported on 09/18/2022 07/18/17   Berton Bon, MD  ibuprofen (ADVIL) 600 MG tablet Take 1 tablet (600 mg total) by mouth every 6 (six) hours as needed. Patient not taking: Reported on 09/18/2022 03/30/22   Gailen Shelter, PA  loratadine (CLARITIN) 10 MG tablet Take 1 tablet (10 mg total) by mouth daily. Patient not taking: Reported on 09/18/2022 11/02/17   Berton Bon, MD  mirtazapine (REMERON) 7.5 MG tablet Take 1 tablet (7.5 mg  total) by mouth at bedtime. 10/20/22   Etta Grandchild, MD  Olopatadine HCl (PATADAY) 0.2 % SOLN Apply 1 drop to eye 2 (two) times daily. 06/08/17   Mikell, Antionette Poles, MD  potassium chloride (KLOR-CON) 10 MEQ tablet Take 1 tablet (10 mEq total) by mouth daily for 5 days. 08/25/21 08/30/21  Tanda Rockers, PA-C    Family History Family History  Problem Relation Age of Onset   Diabetes Mother    Diabetes Father    Arthritis/Rheumatoid Father    Diabetes Brother    Heart attack Paternal Grandfather     Social History Social History   Tobacco Use   Smoking status: Never   Smokeless  tobacco: Never  Vaping Use   Vaping Use: Never used  Substance Use Topics   Alcohol use: No   Drug use: No     Allergies   Lidocaine, Other, Shrimp extract, Iodinated contrast media, and Shellfish allergy   Review of Systems Review of Systems  Gastrointestinal:  Positive for diarrhea, nausea and vomiting.  Neurological:  Positive for weakness.     Physical Exam Triage Vital Signs ED Triage Vitals  Enc Vitals Group     BP 10/24/22 1950 110/67     Pulse Rate 10/24/22 1950 92     Resp 10/24/22 1950 20     Temp 10/24/22 1957 99.5 F (37.5 C)     Temp Source 10/24/22 1957 Oral     SpO2 10/24/22 1950 96 %     Weight --      Height --      Head Circumference --      Peak Flow --      Pain Score 10/24/22 1950 5     Pain Loc --      Pain Edu? --      Excl. in GC? --    No data found.  Updated Vital Signs BP 110/67 (BP Location: Left Arm)   Pulse 92   Temp 99.5 F (37.5 C) (Oral)   Resp 20   SpO2 96%   Visual Acuity Right Eye Distance:   Left Eye Distance:   Bilateral Distance:    Right Eye Near:   Left Eye Near:    Bilateral Near:     Physical Exam Vitals and nursing note reviewed.  Constitutional:      General: She is not in acute distress.    Appearance: Normal appearance. She is not ill-appearing or toxic-appearing.  HENT:     Head: Normocephalic and atraumatic.  Eyes:     Pupils: Pupils are equal, round, and reactive to light.  Cardiovascular:     Rate and Rhythm: Normal rate and regular rhythm.     Heart sounds: Normal heart sounds.  Pulmonary:     Effort: Pulmonary effort is normal.     Breath sounds: Normal breath sounds.  Abdominal:     General: Bowel sounds are normal.     Palpations: Abdomen is soft. There is no hepatomegaly or splenomegaly.     Tenderness: There is generalized abdominal tenderness. Negative signs include Rovsing's sign and McBurney's sign.  Skin:    General: Skin is warm and dry.  Neurological:     General: No focal  deficit present.     Mental Status: She is alert and oriented to person, place, and time.  Psychiatric:        Mood and Affect: Mood normal.        Behavior: Behavior normal.  UC Treatments / Results  Labs (all labs ordered are listed, but only abnormal results are displayed) Labs Reviewed  POCT URINALYSIS DIP (MANUAL ENTRY) - Abnormal; Notable for the following components:      Result Value   Blood, UA trace-intact (*)    All other components within normal limits    EKG   Radiology No results found.  Procedures Procedures (including critical care time)  Medications Ordered in UC Medications - No data to display  Initial Impression / Assessment and Plan / UC Course  I have reviewed the triage vital signs and the nursing notes.  Pertinent labs & imaging results that were available during my care of the patient were reviewed by me and considered in my medical decision making (see chart for details).     Due to exam and symptoms with patient.  Patient is well-appearing and in no acute distress.  No red flags on exam UA is negative for UTI Discussed viral enteritis and symptomatic treatment Zofran as needed nausea/vomiting Encourage fluids with Gatorade, Powerade, Pedialyte, water Brat diet advance as tolerated Rest Patient to follow-up with PCP if symptoms or not improving Strict ER precautions reviewed and patient verbalized understanding Final Clinical Impressions(s) / UC Diagnoses   Final diagnoses:  Viral gastroenteritis     Discharge Instructions      Zofran as needed for nausea or vomiting Rest and fluids.  Increase your fluid intake with Gatorade, Powerade, Pedialyte, water Bland diet and advance as you tolerate Please follow-up with your PCP if you are not improving Please go to the ER for any worsening symptoms    ED Prescriptions     Medication Sig Dispense Auth. Provider   ondansetron (ZOFRAN-ODT) 4 MG disintegrating tablet Take 1 tablet  (4 mg total) by mouth every 8 (eight) hours as needed for nausea or vomiting. 20 tablet Radford Pax, NP      PDMP not reviewed this encounter.   Radford Pax, NP 10/24/22 2017

## 2022-10-26 ENCOUNTER — Ambulatory Visit: Payer: 59 | Admitting: Nurse Practitioner

## 2022-10-26 DIAGNOSIS — Z79899 Other long term (current) drug therapy: Secondary | ICD-10-CM | POA: Diagnosis not present

## 2022-10-26 DIAGNOSIS — R110 Nausea: Secondary | ICD-10-CM | POA: Diagnosis not present

## 2022-10-26 DIAGNOSIS — R109 Unspecified abdominal pain: Secondary | ICD-10-CM | POA: Diagnosis not present

## 2022-10-26 DIAGNOSIS — R197 Diarrhea, unspecified: Secondary | ICD-10-CM | POA: Diagnosis not present

## 2022-10-26 DIAGNOSIS — Z743 Need for continuous supervision: Secondary | ICD-10-CM | POA: Diagnosis not present

## 2022-10-26 DIAGNOSIS — Z7982 Long term (current) use of aspirin: Secondary | ICD-10-CM | POA: Diagnosis not present

## 2022-10-31 ENCOUNTER — Encounter: Payer: Self-pay | Admitting: Emergency Medicine

## 2022-10-31 ENCOUNTER — Ambulatory Visit (INDEPENDENT_AMBULATORY_CARE_PROVIDER_SITE_OTHER): Payer: 59 | Admitting: Emergency Medicine

## 2022-10-31 VITALS — BP 112/68 | HR 53 | Temp 98.2°F | Ht 61.0 in | Wt 109.0 lb

## 2022-10-31 DIAGNOSIS — H1132 Conjunctival hemorrhage, left eye: Secondary | ICD-10-CM | POA: Insufficient documentation

## 2022-10-31 DIAGNOSIS — F410 Panic disorder [episodic paroxysmal anxiety] without agoraphobia: Secondary | ICD-10-CM | POA: Diagnosis not present

## 2022-10-31 DIAGNOSIS — M329 Systemic lupus erythematosus, unspecified: Secondary | ICD-10-CM

## 2022-10-31 DIAGNOSIS — F411 Generalized anxiety disorder: Secondary | ICD-10-CM

## 2022-10-31 DIAGNOSIS — K219 Gastro-esophageal reflux disease without esophagitis: Secondary | ICD-10-CM | POA: Diagnosis not present

## 2022-10-31 NOTE — Assessment & Plan Note (Signed)
Well-controlled. Scheduled for upper endoscopy and colonoscopy next month Advised to stop omeprazole

## 2022-10-31 NOTE — Patient Instructions (Signed)
Hemorragia subconjuntival Subconjunctival Hemorrhage La hemorragia subconjuntival es el sangrado que se produce entre la parte blanca del ojo (esclertica) y la membrana transparente que recubre la parte externa de este rgano (conjuntiva). Cerca de la superficie del ojo hay muchos vasos sanguneos diminutos. La hemorragia subconjuntival ocurre cuando uno o ms de estos vasos sanguneos se rompen y sangran, lo que deriva en la aparicin de una mancha roja en el ojo. Esta es similar a un moretn. En funcin de la magnitud del sangrado, la mancha roja puede cubrir nicamente una pequea zona del ojo o la totalidad de la parte visible de la esclertica. Si se acumula mucha sangre debajo de la conjuntiva, tambin puede haber inflamacin. Las hemorragias subconjuntivales no afectan la visin ni causan dolor, pero puede haber sensacin de irritacin ocular si hay inflamacin. En general, las hemorragias subconjuntivales no requieren tratamiento y, usualmente, desaparecen solas en el trmino de dos a cuatro semanas. Cules son las causas? Esta afeccin puede ser causada por lo siguiente: Un traumatismo leve, como frotarse los ojos con mucha fuerza. Lesiones por contusiones, como por ejemplo practicar deportes o tener contacto con un airbag desplegado. Toser, estornudar o vomitar. Realizar esfuerzos, como ocurre al levantar un objeto pesado. Trastornos mdicos tales como: Presin arterial alta. Diabetes. Cirugas recientes del ojo. Algunos medicamentos, especialmente los anticoagulantes, incluida la aspirina. Otras afecciones, como los tumores en los ojos, los trastornos hemorrgicos o las anomalas de los vasos sanguneos. Las hemorragias subconjuntivales tambin pueden producirse sin una causa aparente. Cules son los signos o sntomas? Los sntomas de esta afeccin incluyen: Una mancha de color rojo oscuro o brillante en la parte blanca del ojo. La zona roja puede: Extenderse hasta cubrir una zona  ms grande del ojo antes de desaparecer. Cambiar de color, como rosa o amarillo amarronado, antes de desaparecer. Hinchazn alrededor del ojo. Irritacin leve del ojo. Cmo se diagnostica? Esta afeccin se diagnostica mediante un examen fsico. Si la hemorragia subconjuntival fue causada por un traumatismo, el mdico puede derivarlo a un oculista (oftalmlogo) o a otro especialista para que lo examinen en busca de otras lesiones. Pueden hacerle otras pruebas, por ejemplo: Un examen ocular que incluye una prueba de visin, una revisin ocular con un tipo de microscopio (lmpara de hendidura) y la medicin de la presin ocular. Es posible que se le dilate el ojo, especialmente si la hemorragia subconjuntival fue causada por un traumatismo. Un control de la presin arterial. Anlisis de sangre para detectar la presencia de trastornos hemorrgicos. Si la hemorragia subconjuntival fue causada por un traumatismo, pueden hacerle radiografas o una exploracin por tomografa computarizada (TC) para determinar si hay otras lesiones. Cmo se trata? Generalmente, no se requiere un tratamiento para esta afeccin. Si tiene molestias, el mdico puede recomendarle que se aplique gotas oftlmicas o compresas fras. Siga estas instrucciones en su casa: Tome los medicamentos de venta libre y los recetados solamente como se lo haya indicado el mdico. Aplique las gotas oftlmicas o las compresas fras para aliviar las molestias como se lo haya indicado el mdico. Evite las actividades, las cosas y los entornos que pueden causarle irritacin o lesiones en el ojo. Concurra a todas las visitas de seguimiento. Esto es importante. Comunquese con un mdico si: Siente dolor en el ojo. El sangrado no desaparece en el trmino de 4 semanas. Sigue teniendo nuevas hemorragias subconjuntivales. Solicite ayuda de inmediato si: Tiene cambios en la visin, dificultad para ver o visin doble. Repentinamente, tiene mucha  sensibilidad a la luz. Tiene dolor   de cabeza intenso, vmitos persistentes, confusin o un cansancio que no es normal (letargo). Parece que el ojo sobresale o se protruye de la rbita. Le aparecen moretones en el cuerpo sin motivo. Tiene sangrado en otra parte del cuerpo sin motivo. Estos sntomas pueden representar un problema grave que constituye una emergencia. No espere a ver si los sntomas desaparecen. Solicite atencin mdica de inmediato. Comunquese con el servicio de emergencias de su localidad (911 en los [place]). No conduzca por sus propios medios hasta el hospital. Resumen La hemorragia subconjuntival es el sangrado que se produce entre la parte blanca del ojo y la membrana transparente que recubre la parte externa de este rgano. Esta afeccin es similar a un moretn. En general, las hemorragias subconjuntivales no requieren tratamiento y, usualmente, desaparecen solas en el trmino de dos a cuatro semanas. Aplique las gotas oftlmicas o las compresas fras para aliviar las molestias como se lo haya indicado el mdico. Esta informacin no tiene como fin reemplazar el consejo del mdico. Asegrese de hacerle al mdico cualquier pregunta que tenga. Document Revised: 09/28/2020 Document Reviewed: 09/28/2020 [employer] Patient Education  2023 [employer]  

## 2022-10-31 NOTE — Assessment & Plan Note (Signed)
Stable without complications. No specific treatment needed at this time

## 2022-10-31 NOTE — Progress Notes (Signed)
Cheryl White 62 y.o.   Chief Complaint  Patient presents with   Follow-up    ED follow f/u Diarrhea     HISTORY OF PRESENT ILLNESS: This is a 62 y.o. female here for follow up of emergency department visit when she presented with diarrhea on 10/26/2022 Also recently had URI. Much improved today. History of lupus on Plaquenil. Developed side effects to Cymbalta.  Not taking it. Complaining of redness to left eye No other complaints or medical concerns today.  HPI   Prior to Admission medications   Medication Sig Start Date End Date Taking? Authorizing Provider  benzonatate (TESSALON) 100 MG capsule Take 100 mg by mouth as needed for cough.   Yes [provider]  fluticasone (FLONASE) 50 MCG/ACT nasal spray Place 2 sprays into both nostrils daily. 11/02/17  Yes Mikell, Antionette Poles, MD  hydroxychloroquine (PLAQUENIL) 200 MG tablet Take by mouth daily.   Yes [provider]  hydrOXYzine (ATARAX/VISTARIL) 10 MG tablet Take 1 tablet (10 mg total) by mouth 3 (three) times daily as needed. 07/18/17  Yes Mikell, Antionette Poles, MD  loratadine (CLARITIN) 10 MG tablet Take 1 tablet (10 mg total) by mouth daily. 11/02/17  Yes Mikell, Antionette Poles, MD  Olopatadine HCl (PATADAY) 0.2 % SOLN Apply 1 drop to eye 2 (two) times daily. 06/08/17  Yes Mikell, Antionette Poles, MD  alendronate (FOSAMAX) 70 MG tablet Take 70 mg by mouth once a week. Patient not taking: Reported on 10/17/2022    [provider]  aspirin 81 MG chewable tablet Chew 1 tablet (81 mg total) by mouth daily. Patient not taking: Reported on 10/17/2022 07/20/21   Charlynne Pander, MD  esomeprazole (NEXIUM) 40 MG capsule Take by mouth. Patient not taking: Reported on 10/17/2022 08/07/22   [provider]  famotidine (PEPCID) 40 MG tablet Take by mouth. Patient not taking: Reported on 10/17/2022 08/07/22   [provider]  ibuprofen (ADVIL) 600 MG tablet Take 1 tablet (600 mg total) by mouth every 6 (six)  hours as needed. Patient not taking: Reported on 09/18/2022 03/30/22   Gailen Shelter, PA  mirtazapine (REMERON) 7.5 MG tablet Take 1 tablet (7.5 mg total) by mouth at bedtime. Patient not taking: Reported on 10/31/2022 10/20/22   Etta Grandchild, MD  ondansetron (ZOFRAN-ODT) 4 MG disintegrating tablet Take 1 tablet (4 mg total) by mouth every 8 (eight) hours as needed for nausea or vomiting. Patient not taking: Reported on 10/31/2022 10/24/22   Radford Pax, NP  potassium chloride (KLOR-CON) 10 MEQ tablet Take 1 tablet (10 mEq total) by mouth daily for 5 days. 08/25/21 08/30/21  Tanda Rockers, PA-C    Allergies  Allergen Reactions   Lidocaine Shortness Of Breath    Allergy actually to Septocaine, not Lidocaine. Palpation and SOB   Other Anaphylaxis   Shrimp Extract Anaphylaxis   Iodinated Contrast Media Other (See Comments) and Rash   Shellfish Allergy     Patient Active Problem List   Diagnosis Date Noted   History of thyroid nodule 09/18/2022   History of osteoporosis 09/18/2022   Memory difficulty 11/02/2017   Otomycosis 09/20/2017   Panic attacks 07/18/2017   GERD (gastroesophageal reflux disease) 07/03/2017   Allergic rhinitis 06/08/2017   Lupus (systemic lupus erythematosus) 05/09/2017   Raynaud disease 05/09/2017   Generalized anxiety disorder 05/09/2017    Past Medical History:  Diagnosis Date   Lupus    Raynaud's disease    Sjogren's disease  Past Surgical History:  Procedure Laterality Date   BREAST SURGERY     CESAREAN SECTION     TUBAL LIGATION      Social History   Socioeconomic History   Marital status: Divorced    Spouse name: Not on file   Number of children: Not on file   Years of education: Not on file   Highest education level: Not on file  Occupational History   Occupation: Cleaning houses  Tobacco Use   Smoking status: Never   Smokeless tobacco: Never  Vaping Use   Vaping Use: Never used  Substance and Sexual Activity   Alcohol use:  No   Drug use: No   Sexual activity: Not Currently    Comment: Has been Divorced for 12 years   Other Topics Concern   Not on file  Social History Narrative   Patient divorced 12 years. Patient had three children - two boys and 1 girl in there 60s- 30s. Eldest is  studying education in [place] - getting PHD.    Patient lives on her own in [place].    Social Determinants of Health   Financial Resource Strain: Not on file  Food Insecurity: Not on file  Transportation Needs: Not on file  Physical Activity: Not on file  Stress: Not on file  Social Connections: Not on file  Intimate Partner Violence: Not on file    Family History  Problem Relation Age of Onset   Diabetes Mother    Diabetes Father    Arthritis/Rheumatoid Father    Diabetes Brother    Heart attack Paternal Grandfather      Review of Systems  Constitutional: Negative.  Negative for chills and fever.  HENT:  Positive for sore throat. Negative for congestion.   Respiratory: Negative.  Negative for cough and shortness of breath.   Cardiovascular: Negative.  Negative for chest pain and palpitations.  Gastrointestinal:  Negative for abdominal pain, diarrhea, nausea and vomiting.  Genitourinary: Negative.  Negative for dysuria and hematuria.  Skin: Negative.  Negative for rash.  Neurological: Negative.  Negative for dizziness and headaches.  All other systems reviewed and are negative.   Vitals:   10/31/22 0832  BP: 112/68  Pulse: (!) 53  Temp: 98.2 F (36.8 C)  SpO2: 98%    Physical Exam Vitals reviewed.  Constitutional:      Appearance: Normal appearance.  HENT:     Head: Normocephalic.     Mouth/Throat:     Mouth: Mucous membranes are moist.     Pharynx: Oropharynx is clear.  Eyes:     Extraocular Movements: Extraocular movements intact.     Conjunctiva/sclera: Conjunctivae normal.     Pupils: Pupils are equal, round, and reactive to light.     Comments: Subconjunctival hemorrhage left eye  medial aspect  Cardiovascular:     Rate and Rhythm: Normal rate and regular rhythm.     Pulses: Normal pulses.     Heart sounds: Normal heart sounds.  Pulmonary:     Effort: Pulmonary effort is normal.     Breath sounds: Normal breath sounds.  Abdominal:     Palpations: Abdomen is soft.     Tenderness: There is no abdominal tenderness.  Musculoskeletal:     Cervical back: No tenderness.  Lymphadenopathy:     Cervical: No cervical adenopathy.  Skin:    General: Skin is warm and dry.     Capillary Refill: Capillary refill takes less than 2 seconds.  Neurological:  General: No focal deficit present.     Mental Status: She is alert and oriented to person, place, and time.  Psychiatric:        Mood and Affect: Mood normal.        Behavior: Behavior normal.      ASSESSMENT & PLAN: A total of 42 minutes was spent with the patient and counseling/coordination of care regarding preparing for this visit, review of most recent office visit notes, review of most recent emergency department visit notes, reviewed most recent blood work results, review of multiple chronic medical conditions and their management, review of all medications, review of health maintenance items, education and nutrition, prognosis, documentation, and need for follow-up.  Problem List Items Addressed This Visit       Digestive   GERD (gastroesophageal reflux disease)    Well-controlled. Scheduled for upper endoscopy and colonoscopy next month Advised to stop omeprazole        Other   Lupus (systemic lupus erythematosus)    Stable.  Well-controlled. No complications. Continues Plaquenil 200 mg daily      Generalized anxiety disorder    Stable.  Did not tolerate Cymbalta. No concerns at this time.      Panic attacks    Well-controlled. Did not tolerate Cymbalta.      Subconjunctival hemorrhage of left eye - Primary    Stable without complications. No specific treatment needed at this time       Patient Instructions  Hemorragia subconjuntival Subconjunctival Hemorrhage La hemorragia subconjuntival es el sangrado que se produce entre la parte blanca del ojo (esclertica) y la membrana transparente que recubre la parte externa de este rgano (conjuntiva). Cerca de la superficie del ojo hay muchos vasos sanguneos diminutos. La hemorragia subconjuntival ocurre cuando uno o ms de estos vasos sanguneos se rompen y Water quality scientist, lo que deriva en la aparicin de una mancha roja en el ojo. Esta es similar a Comptroller. En funcin de la magnitud del sangrado, la mancha roja puede cubrir nicamente una pequea zona del ojo o la totalidad de la parte visible de Leisure centre manager. Si se acumula mucha sangre debajo de la conjuntiva, tambin puede haber inflamacin. Las hemorragias subconjuntivales no afectan la visin ni Teaching laboratory technician, pero puede haber sensacin de irritacin ocular si hay inflamacin. En general, las hemorragias subconjuntivales no requieren tratamiento y, [place], desaparecen solas en el trmino de dos a cuatro semanas. Cules son las causas? Esta afeccin puede ser causada por lo siguiente: Un traumatismo leve, como frotarse los ojos con mucha fuerza. Lesiones por contusiones, como por ejemplo practicar deportes o tener contacto con un airbag desplegado. Toser, estornudar o vomitar. Realizar esfuerzos, como ocurre al levantar un objeto pesado. Trastornos mdicos tales como: Presin arterial alta. Diabetes. Cirugas recientes del ojo. Algunos medicamentos, especialmente los anticoagulantes, incluida la aspirina. Otras afecciones, como los tumores en los ojos, los trastornos hemorrgicos o las anomalas de los vasos sanguneos. Las hemorragias subconjuntivales tambin pueden producirse sin una causa aparente. Cules son los signos o sntomas? Los sntomas de esta afeccin incluyen: Una mancha de color rojo oscuro o brillante en la parte blanca del ojo. La zona roja  puede: Extenderse hasta cubrir una zona ms grande del ojo antes de Geneticist, molecular. Cambiar de color, como rosa o amarillo amarronado, antes de Geneticist, molecular. Hinchazn alrededor del ojo. Irritacin leve del ojo. Cmo se diagnostica? Esta afeccin se diagnostica mediante un examen fsico. Si la hemorragia subconjuntival fue causada por un traumatismo, el mdico puede derivarlo a un  oculista (oftalmlogo) o a Dietitian para que lo examinen en busca de otras lesiones. Pueden hacerle otras pruebas, por ejemplo: Un examen ocular que incluye una prueba de visin, una revisin ocular con un tipo de microscopio (lmpara de hendidura) y la medicin de la presin ocular. Es posible que se le dilate el ojo, especialmente si la hemorragia subconjuntival fue causada por un traumatismo. Un control de la presin arterial. Anlisis de sangre para detectar la presencia de trastornos hemorrgicos. Si la hemorragia subconjuntival fue causada por un traumatismo, pueden hacerle radiografas o una exploracin por tomografa computarizada (TC) para determinar si hay otras lesiones. Cmo se trata? Generalmente, no se requiere un tratamiento para esta afeccin. Si tiene molestias, el mdico puede recomendarle que se aplique gotas oftlmicas o compresas fras. Siga estas instrucciones en su casa: Tome los medicamentos de venta libre y los recetados solamente como se lo haya indicado el mdico. Aplique las gotas oftlmicas o las compresas fras para Paramedic las molestias como se lo haya indicado el mdico. Evite las [place], las cosas y los entornos que pueden causarle irritacin o lesiones en el ojo. Concurra a todas las visitas de seguimiento. Esto es importante. Comunquese con un mdico si: Siente dolor en el ojo. El sangrado no desaparece en el trmino de 4 semanas. Sigue teniendo nuevas hemorragias subconjuntivales. Solicite ayuda de inmediato si: Tiene cambios en la visin, dificultad para ver o visin  doble. Repentinamente, tiene mucha sensibilidad a la luz. Tiene dolor de cabeza intenso, vmitos persistentes, confusin o un cansancio que no es normal (letargo). Parece que el ojo sobresale o se protruye de la rbita. Le aparecen moretones en el cuerpo sin motivo. Tiene sangrado en otra parte del cuerpo sin motivo. Estos sntomas pueden representar un problema grave que constituye Radio broadcast assistant. No espere a ver si los sntomas desaparecen. Solicite atencin mdica de inmediato. Comunquese con el servicio de emergencias de su localidad (911 en los [place]). No conduzca por sus propios medios [facility]. Resumen La hemorragia subconjuntival es el sangrado que se produce entre la parte blanca del ojo y la membrana transparente que recubre la parte externa de este rgano. Esta afeccin es similar a un moretn. En general, las hemorragias subconjuntivales no requieren tratamiento y, [place], desaparecen solas en el trmino de dos a cuatro semanas. Aplique las gotas oftlmicas o las compresas fras para Paramedic las [employer] se lo haya indicado el mdico. Esta informacin no tiene Theme park manager el consejo del mdico. Asegrese de hacerle al mdico cualquier pregunta que tenga. Document Revised: 09/28/2020 Document Reviewed: 09/28/2020 Elsevier Patient Education  2023 Elsevier Inc.    Edwina Barth, MD  [facility]

## 2022-10-31 NOTE — Assessment & Plan Note (Signed)
Well-controlled. Did not tolerate Cymbalta.

## 2022-10-31 NOTE — Assessment & Plan Note (Signed)
Stable.  Did not tolerate Cymbalta. No concerns at this time.

## 2022-10-31 NOTE — Assessment & Plan Note (Signed)
Stable.  Well-controlled. No complications. Continues Plaquenil 200 mg daily

## 2022-11-07 ENCOUNTER — Ambulatory Visit: Payer: Self-pay

## 2022-11-08 ENCOUNTER — Ambulatory Visit: Payer: Self-pay

## 2022-11-08 ENCOUNTER — Ambulatory Visit
Admission: EM | Admit: 2022-11-08 | Discharge: 2022-11-08 | Disposition: A | Discharge status: Home / Self Care | Payer: 59

## 2022-11-08 DIAGNOSIS — J309 Allergic rhinitis, unspecified: Secondary | ICD-10-CM | POA: Diagnosis not present

## 2022-11-08 DIAGNOSIS — J018 Other acute sinusitis: Secondary | ICD-10-CM | POA: Diagnosis not present

## 2022-11-08 MED ORDER — AMOXICILLIN 875 MG PO TABS: 875.0000 mg | ORAL_TABLET | Freq: Two times a day (BID) | ORAL | 0 refills | Status: DC

## 2022-11-08 MED ORDER — PSEUDOEPHEDRINE HCL 30 MG PO TABS: 30.0000 mg | ORAL_TABLET | Freq: Three times a day (TID) | ORAL | 0 refills | Status: DC | PRN

## 2022-11-08 NOTE — ED Triage Notes (Signed)
Pt reports, nasal congestion, chest congestion and headache x 10 days. OTC meds gives no relief.   Pt reports she will have a colonoscopy on 11/20/22, and was tod by her  PCP she can not take any antibiotics, aspirin, antihistamine  7 days before the colonoscopy

## 2022-11-08 NOTE — ED Provider Notes (Signed)
[name]  Note:  This document was prepared using Conservation officer, historic buildings and may include unintentional dictation errors.  MRN: [id number] DOB: 1961-04-05  Subjective:   Cheryl White is a 62 y.o. female with pmh of SLE, Raynaud presenting for 10-day history of acute onset persistent and worsening sinus congestion, sinus pressure, sinus headaches, throat pain, coughing, chest congestion.  No chest pain, shortness of breath or wheezing.  No history of asthma.  Patient takes Zyrtec for her allergies.  No smoking of any kind including cigarettes, cigars, vaping, marijuana use.    No current facility-administered medications for this encounter.  Current Outpatient Medications:    alendronate (FOSAMAX) 70 MG tablet, Take 70 mg by mouth once a week. (Patient not taking: Reported on 10/17/2022), Disp: , Rfl:    aspirin 81 MG chewable tablet, Chew 1 tablet (81 mg total) by mouth daily. (Patient not taking: Reported on 10/17/2022), Disp: 30 tablet, Rfl: 0   benzonatate (TESSALON) 100 MG capsule, Take 100 mg by mouth as needed for cough., Disp: , Rfl:    esomeprazole (NEXIUM) 40 MG capsule, Take by mouth. (Patient not taking: Reported on 10/17/2022), Disp: , Rfl:    famotidine (PEPCID) 40 MG tablet, Take by mouth. (Patient not taking: Reported on 10/17/2022), Disp: , Rfl:    fluticasone (FLONASE) 50 MCG/ACT nasal spray, Place 2 sprays into both nostrils daily., Disp: 16 g, Rfl: 6   hydroxychloroquine (PLAQUENIL) 200 MG tablet, Take by mouth daily., Disp: , Rfl:    hydrOXYzine (ATARAX/VISTARIL) 10 MG tablet, Take 1 tablet (10 mg total) by mouth 3 (three) times daily as needed., Disp: 30 tablet, Rfl: 0   ibuprofen (ADVIL) 600 MG tablet, Take 1 tablet (600 mg total) by mouth every 6 (six) hours as needed. (Patient not taking: Reported on 09/18/2022), Disp: 30 tablet, Rfl: 0   loratadine (CLARITIN) 10 MG tablet, Take 1 tablet (10 mg total) by mouth daily., Disp: 30 tablet, Rfl:  11   mirtazapine (REMERON) 7.5 MG tablet, Take 1 tablet (7.5 mg total) by mouth at bedtime. (Patient not taking: Reported on 10/31/2022), Disp: 30 tablet, Rfl: 0   Olopatadine HCl (PATADAY) 0.2 % SOLN, Apply 1 drop to eye 2 (two) times daily., Disp: 1 Bottle, Rfl: 0   ondansetron (ZOFRAN-ODT) 4 MG disintegrating tablet, Take 1 tablet (4 mg total) by mouth every 8 (eight) hours as needed for nausea or vomiting. (Patient not taking: Reported on 10/31/2022), Disp: 20 tablet, Rfl: 0   potassium chloride (KLOR-CON) 10 MEQ tablet, Take 1 tablet (10 mEq total) by mouth daily for 5 days., Disp: 5 tablet, Rfl: 0   Allergies  Allergen Reactions   Lidocaine Shortness Of Breath    Allergy actually to Septocaine, not Lidocaine. Palpation and SOB   Other Anaphylaxis   Shrimp Extract Anaphylaxis   Iodinated Contrast Media Other (See Comments) and Rash   Shellfish Allergy     Past Medical History:  Diagnosis Date   Lupus (HCC)    Raynaud's disease    Sjogren's disease (HCC)      Past Surgical History:  Procedure Laterality Date   BREAST SURGERY     CESAREAN SECTION     TUBAL LIGATION      Family History  Problem Relation Age of Onset   Diabetes Mother    Diabetes Father    Arthritis/Rheumatoid Father    Diabetes Brother    Heart attack Paternal Grandfather     Social History  Tobacco Use   Smoking status: Never   Smokeless tobacco: Never  Vaping Use   Vaping Use: Never used  Substance Use Topics   Alcohol use: No   Drug use: No    ROS   Objective:   Vitals: There were no vitals taken for this visit.  Physical Exam Constitutional:      General: She is not in acute distress.    Appearance: Normal appearance. She is well-developed and normal weight. She is not ill-appearing, toxic-appearing or diaphoretic.  HENT:     Head: Normocephalic and atraumatic.     Right Ear: Tympanic membrane, ear canal and external ear normal. No drainage or tenderness. No middle ear effusion.  There is no impacted cerumen. Tympanic membrane is not erythematous or bulging.     Left Ear: Tympanic membrane, ear canal and external ear normal. No drainage or tenderness.  No middle ear effusion. There is no impacted cerumen. Tympanic membrane is not erythematous or bulging.     Nose: Congestion and rhinorrhea present.     Mouth/Throat:     Mouth: Mucous membranes are moist. No oral lesions.     Pharynx: No pharyngeal swelling, oropharyngeal exudate, posterior oropharyngeal erythema or uvula swelling.     Tonsils: No tonsillar exudate or tonsillar abscesses.  Eyes:     General: No scleral icterus.       Right eye: No discharge.        Left eye: No discharge.     Extraocular Movements: Extraocular movements intact.     Right eye: Normal extraocular motion.     Left eye: Normal extraocular motion.     Conjunctiva/sclera: Conjunctivae normal.  Cardiovascular:     Rate and Rhythm: Normal rate.  Pulmonary:     Effort: Pulmonary effort is normal.  Musculoskeletal:     Cervical back: Normal range of motion and neck supple.  Lymphadenopathy:     Cervical: No cervical adenopathy.  Skin:    General: Skin is warm and dry.  Neurological:     General: No focal deficit present.     Mental Status: She is alert and oriented to person, place, and time.     Cranial Nerves: No cranial nerve deficit.     Motor: No weakness.     Coordination: Coordination normal.     Gait: Gait normal.  Psychiatric:        Mood and Affect: Mood normal.        Behavior: Behavior normal.     Assessment and Plan :   PDMP not reviewed this encounter.  1. Acute non-recurrent sinusitis of other sinus   2. Allergic rhinitis, unspecified seasonality, unspecified trigger    Deferred imaging given clear cardiopulmonary exam, hemodynamically stable vital signs. Will start empiric treatment for sinusitis with amoxicillin.  Recommended supportive care otherwise. Counseled patient on potential for adverse effects with  medications prescribed/recommended today, ER and return-to-clinic precautions discussed, patient verbalized understanding.    Wallis Bamberg, [place] 11/08/22 [phone]

## 2022-11-21 ENCOUNTER — Other Ambulatory Visit: Payer: Self-pay | Admitting: Internal Medicine

## 2022-11-21 DIAGNOSIS — R12 Heartburn: Secondary | ICD-10-CM | POA: Diagnosis not present

## 2022-11-21 DIAGNOSIS — R197 Diarrhea, unspecified: Secondary | ICD-10-CM | POA: Diagnosis not present

## 2022-11-21 DIAGNOSIS — F411 Generalized anxiety disorder: Secondary | ICD-10-CM

## 2022-11-21 DIAGNOSIS — K319 Disease of stomach and duodenum, unspecified: Secondary | ICD-10-CM | POA: Diagnosis not present

## 2022-11-21 DIAGNOSIS — Z1211 Encounter for screening for malignant neoplasm of colon: Secondary | ICD-10-CM | POA: Diagnosis not present

## 2022-11-21 DIAGNOSIS — K219 Gastro-esophageal reflux disease without esophagitis: Secondary | ICD-10-CM | POA: Diagnosis not present

## 2022-11-21 LAB — HM COLONOSCOPY

## 2022-12-25 DIAGNOSIS — R140 Abdominal distension (gaseous): Secondary | ICD-10-CM | POA: Diagnosis not present

## 2023-01-02 ENCOUNTER — Ambulatory Visit (INDEPENDENT_AMBULATORY_CARE_PROVIDER_SITE_OTHER): Payer: 59 | Admitting: Diagnostic Neuroimaging

## 2023-01-02 ENCOUNTER — Telehealth: Payer: Self-pay | Admitting: Diagnostic Neuroimaging

## 2023-01-02 ENCOUNTER — Encounter: Payer: Self-pay | Admitting: Diagnostic Neuroimaging

## 2023-01-02 VITALS — BP 104/68 | HR 60 | Ht 61.0 in | Wt 113.8 lb

## 2023-01-02 DIAGNOSIS — M5416 Radiculopathy, lumbar region: Secondary | ICD-10-CM | POA: Diagnosis not present

## 2023-01-02 NOTE — Progress Notes (Signed)
[facility]  PATIENT: Cheryl White DOB: 05-22-1961  REFERRING CLINICIAN: Casimer Lanius, MD HISTORY FROM: patient REASON FOR VISIT: new consult   HISTORICAL  CHIEF COMPLAINT:  Chief Complaint  Patient presents with   New Patient (Initial Visit)    Rm 7, here with interpreter Gerilyn Pilgrim Pt presents for bilateral leg/feet tingling and numbness. Right leg pain/tingling, also states she feels a cold sensation on feet     HISTORY OF PRESENT ILLNESS:   62 year old female here for evaluation of right leg pain.  Symptoms started around 2020 with pain radiating from her right lower back, right hip down her right leg.  Has some numbness, cool sensation in the right foot.  She saw orthopedic clinic, had MRI of the lumbar spine but results not available today.  She then saw neurology consult at [facility] and was recommended to pursue conservative management of right lumbar radiculopathy.  Outside notes mention that symptoms improved although patient denies that today.  Patient states that symptoms have been consistent since that time.  She also relates onset of symptoms to a time when she was cleaning a room and she banged her right thigh into a hard wooden bed frame.  Patient has history of connective tissue disorder, lupus, inflammatory arthritis, on treatment by rheumatology Dr. Deanne Coffer.  His notes mention consult to neurology for bilateral lower extremity and feet numbness or tingling although patient denies any problems in the left leg today.  No problems in fingers or hands.   REVIEW OF SYSTEMS: Full 14 system review of systems performed and negative with exception of: as per HPI.  ALLERGIES: Allergies  Allergen Reactions   Lidocaine Shortness Of Breath    Allergy actually to Septocaine, not Lidocaine. Palpation and SOB   Other Anaphylaxis   Shrimp Extract Anaphylaxis   Iodinated Contrast Media Other (See Comments) and Rash   Shellfish Allergy     HOME  MEDICATIONS: Outpatient Medications Prior to Visit  Medication Sig Dispense Refill   alendronate (FOSAMAX) 70 MG tablet Take 70 mg by mouth once a week.     amoxicillin (AMOXIL) 875 MG tablet Take 1 tablet (875 mg total) by mouth 2 (two) times daily. 14 tablet 0   aspirin 81 MG chewable tablet Chew 1 tablet (81 mg total) by mouth daily. 30 tablet 0   benzonatate (TESSALON) 100 MG capsule Take 100 mg by mouth as needed for cough.     cetirizine (ZYRTEC) 10 MG chewable tablet Chew 10 mg by mouth daily.     hydroxychloroquine (PLAQUENIL) 200 MG tablet Take by mouth daily.     hydrOXYzine (ATARAX/VISTARIL) 10 MG tablet Take 1 tablet (10 mg total) by mouth 3 (three) times daily as needed. 30 tablet 0   LORazepam (ATIVAN) 0.5 MG tablet Take 0.5 mg by mouth daily.     mirtazapine (REMERON) 7.5 MG tablet Take 1 tablet (7.5 mg total) by mouth at bedtime. 30 tablet 0   Olopatadine HCl (PATADAY) 0.2 % SOLN Apply 1 drop to eye 2 (two) times daily. 1 Bottle 0   omeprazole (PRILOSEC) 40 MG capsule Take 40 mg by mouth daily.     ondansetron (ZOFRAN-ODT) 4 MG disintegrating tablet Take 1 tablet (4 mg total) by mouth every 8 (eight) hours as needed for nausea or vomiting. 20 tablet 0   pseudoephedrine (SUDAFED) 30 MG tablet Take 1 tablet (30 mg total) by mouth every 8 (eight) hours as needed for congestion. 30 tablet 0   esomeprazole (NEXIUM)  40 MG capsule Take by mouth. (Patient not taking: Reported on 10/17/2022)     famotidine (PEPCID) 40 MG tablet Take by mouth. (Patient not taking: Reported on 10/17/2022)     fluticasone (FLONASE) 50 MCG/ACT nasal spray Place 2 sprays into both nostrils daily. (Patient not taking: Reported on 01/02/2023) 16 g 6   ibuprofen (ADVIL) 600 MG tablet Take 1 tablet (600 mg total) by mouth every 6 (six) hours as needed. (Patient not taking: Reported on 09/18/2022) 30 tablet 0   loratadine (CLARITIN) 10 MG tablet Take 1 tablet (10 mg total) by mouth daily. (Patient not taking: Reported on  01/02/2023) 30 tablet 11   potassium chloride (KLOR-CON) 10 MEQ tablet Take 1 tablet (10 mEq total) by mouth daily for 5 days. 5 tablet 0   No facility-administered medications prior to visit.    PAST MEDICAL HISTORY: Past Medical History:  Diagnosis Date   Lupus (HCC)    Raynaud's disease    Sjogren's disease (HCC)     PAST SURGICAL HISTORY: Past Surgical History:  Procedure Laterality Date   BREAST SURGERY     CESAREAN SECTION     TUBAL LIGATION      FAMILY HISTORY: Family History  Problem Relation Age of Onset   Diabetes Mother    Diabetes Father    Arthritis/Rheumatoid Father    Diabetes Brother    Heart attack Paternal Grandfather     SOCIAL HISTORY: Social History   Socioeconomic History   Marital status: Divorced    Spouse name: Not on file   Number of children: Not on file   Years of education: Not on file   Highest education level: Not on file  Occupational History   Occupation: Education officer, [employer]  Tobacco Use   Smoking status: Never   Smokeless tobacco: Never  Vaping Use   Vaping Use: Never used  Substance and Sexual Activity   Alcohol use: No   Drug use: No   Sexual activity: Not Currently    Comment: Has been Divorced for 12 years   Other Topics Concern   Not on file  Social History Narrative   Patient divorced 12 years. Patient had three children - two boys and 1 girl in there 54s- 30s. Eldest is  studying education in [place] - getting PHD.    Patient lives on her own in [place].    Social Determinants of Health   Financial Resource Strain: Not on file  Food Insecurity: Not on file  Transportation Needs: Not on file  Physical Activity: Not on file  Stress: Not on file  Social Connections: Not on file  Intimate Partner Violence: Not on file     PHYSICAL EXAM  GENERAL EXAM/CONSTITUTIONAL: Vitals:  Vitals:   01/02/23 0949  BP: 104/68  Pulse: 60  Weight: 113 lb 12.8 oz (51.6 kg)  Height: 5\' 1"  (1.549 m)   Body mass index is 21.5  kg/m. Wt Readings from Last 3 Encounters:  01/02/23 113 lb 12.8 oz (51.6 kg)  10/31/22 109 lb (49.4 kg)  10/17/22 111 lb (50.3 kg)   Patient is in no distress; well developed, nourished and groomed; neck is supple  CARDIOVASCULAR: Examination of carotid arteries is normal; no carotid bruits Regular rate and rhythm, no murmurs Examination of peripheral vascular system by observation and palpation is normal  EYES: Ophthalmoscopic exam of optic discs and posterior segments is normal; no papilledema or hemorrhages No results found.  MUSCULOSKELETAL: Gait, strength, tone, movements noted in Neurologic exam  below  NEUROLOGIC: MENTAL STATUS:      No data to display         awake, alert, oriented to person, place and time recent and remote memory intact normal attention and concentration language fluent, comprehension intact, naming intact fund of knowledge appropriate  CRANIAL NERVE:  2nd - no papilledema on fundoscopic exam 2nd, 3rd, 4th, 6th - pupils equal and reactive to light, visual fields full to confrontation, extraocular muscles intact, no nystagmus 5th - facial sensation symmetric 7th - facial strength symmetric 8th - hearing intact 9th - palate elevates symmetrically, uvula midline 11th - shoulder shrug symmetric 12th - tongue protrusion midline  MOTOR:  normal bulk and tone, full strength in the BUE, BLE; except RIGHT HIP FLEX 4+ and RIGHT FOOT DF 4+  SENSORY:  normal and symmetric to light touch, temperature, vibration  COORDINATION:  finger-nose-finger, fine finger movements normal  REFLEXES:  deep tendon reflexes 1+ and symmetric  GAIT/STATION:  narrow based gait     DIAGNOSTIC DATA (LABS, IMAGING, TESTING) - I reviewed patient records, labs, notes, testing and imaging myself where available.  Lab Results  Component Value Date   WBC 3.3 (L) 09/18/2022   HGB 14.9 09/18/2022   HCT 45.3 09/18/2022   MCV 83.1 09/18/2022   PLT 185.0 09/18/2022       Component Value Date/Time   NA 141 09/18/2022 0946   NA 147 (H) 11/02/2017 1554   K 4.2 09/18/2022 0946   CL 105 09/18/2022 0946   CO2 29 09/18/2022 0946   GLUCOSE 82 09/18/2022 0946   BUN 15 09/18/2022 0946   BUN 12 11/02/2017 1554   CREATININE 0.67 09/18/2022 0946   CALCIUM 10.0 09/18/2022 0946   PROT 7.8 09/18/2022 0946   PROT 7.7 05/09/2017 1027   ALBUMIN 4.5 09/18/2022 0946   ALBUMIN 4.7 05/09/2017 1027   AST 26 09/18/2022 0946   ALT 31 09/18/2022 0946   ALKPHOS 105 09/18/2022 0946   BILITOT 0.4 09/18/2022 0946   BILITOT 0.3 05/09/2017 1027   GFRNONAA >60 08/25/2021 2135   GFRAA 112 11/02/2017 1554   Lab Results  Component Value Date   CHOL 177 09/18/2022   HDL 48.20 09/18/2022   LDLCALC 102 (H) 09/18/2022   TRIG 137.0 09/18/2022   CHOLHDL 4 09/18/2022   Lab Results  Component Value Date   HGBA1C 5.1 09/18/2022   Lab Results  Component Value Date   VITAMINB12 563 11/02/2017   Lab Results  Component Value Date   TSH 0.92 09/18/2022    04/24/19 xray lumbar spine - Mild degenerative changes of the lumbar spine without acute osseous abnormality.   ASSESSMENT AND PLAN  62 y.o. year old female here with:   Dx:  1. Right lumbar radiculopathy     PLAN:  RIGHT LOW BACK PAIN --> radiating to the right leg and foot (since ~2020) - check MRI lumbar spine (lumbar xray shows degenerative changes; failed conservative mgmt > 6 weeks) - then consider referral to pain mgmt for epidural injection, medication, PT exercises  Orders Placed This Encounter  Procedures   MR LUMBAR SPINE WO CONTRAST   Return for pending if symptoms worsen or fail to improve, pending test results.    Suanne Marker, MD 01/02/2023, 10:26 AM Certified in Neurology, Neurophysiology and Neuroimaging  [facility] [address] [place], [place] [address] [phone]

## 2023-01-02 NOTE — Telephone Encounter (Signed)
[employer] medicare/[place] med[place] NPR sent to GI [phone] 

## 2023-01-02 NOTE — Patient Instructions (Signed)
  RIGHT LOW BACK PAIN --> radiating to the right leg and foot (since ~2020) - check MRI lumbar spine (lumbar xray shows degenerative changes; failed conservative mgmt > 6 weeks) - then consider referral to pain mgmt for epidural injection, medication, PT exercises

## 2023-01-08 ENCOUNTER — Encounter: Payer: Self-pay | Admitting: Emergency Medicine

## 2023-01-08 DIAGNOSIS — M79643 Pain in unspecified hand: Secondary | ICD-10-CM | POA: Diagnosis not present

## 2023-01-08 DIAGNOSIS — M329 Systemic lupus erythematosus, unspecified: Secondary | ICD-10-CM | POA: Diagnosis not present

## 2023-01-08 DIAGNOSIS — M1990 Unspecified osteoarthritis, unspecified site: Secondary | ICD-10-CM | POA: Diagnosis not present

## 2023-01-08 DIAGNOSIS — I7300 Raynaud's syndrome without gangrene: Secondary | ICD-10-CM | POA: Diagnosis not present

## 2023-01-08 NOTE — Telephone Encounter (Signed)
This MRI of the lumbar spine was not ordered by me.  Doctor who ordered it should take care of anxiety medication she might need prior to it.

## 2023-01-09 ENCOUNTER — Encounter: Payer: Self-pay | Admitting: Diagnostic Neuroimaging

## 2023-01-09 MED ORDER — ALPRAZOLAM 0.5 MG PO TABS: ORAL_TABLET | ORAL | 0 refills | Status: AC

## 2023-01-09 NOTE — Telephone Encounter (Signed)
Meds ordered this encounter  Medications   ALPRAZolam (XANAX) 0.5 MG tablet    Sig: for sedation before MRI scan; take 1 tab 1 hour before scan; may repeat 1 tab 15 min before scan    Dispense:  3 tablet    Refill:  0    Suanne Marker, MD 01/09/2023, 5:41 PM Certified in Neurology, Neurophysiology and Neuroimaging  [facility] [address], [address] [place], [place] [address] [phone]

## 2023-01-23 ENCOUNTER — Telehealth: Payer: Self-pay

## 2023-01-23 ENCOUNTER — Ambulatory Visit: Payer: 59

## 2023-01-23 NOTE — Telephone Encounter (Signed)
Interpreter unable to reach patient on preferred number listed in notes for scheduled AWV. Left message on voicemail okay to reschedule

## 2023-01-24 DIAGNOSIS — J984 Other disorders of lung: Secondary | ICD-10-CM | POA: Diagnosis not present

## 2023-01-25 DIAGNOSIS — R053 Chronic cough: Secondary | ICD-10-CM | POA: Diagnosis not present

## 2023-01-27 ENCOUNTER — Ambulatory Visit (INDEPENDENT_AMBULATORY_CARE_PROVIDER_SITE_OTHER): Payer: 59

## 2023-01-27 ENCOUNTER — Ambulatory Visit
Admission: EM | Admit: 2023-01-27 | Discharge: 2023-01-27 | Disposition: A | Discharge status: Home / Self Care | Payer: 59 | Attending: Urgent Care | Admitting: Urgent Care

## 2023-01-27 ENCOUNTER — Telehealth: Payer: Self-pay

## 2023-01-27 DIAGNOSIS — M5032 Other cervical disc degeneration, mid-cervical region, unspecified level: Secondary | ICD-10-CM | POA: Diagnosis not present

## 2023-01-27 DIAGNOSIS — M329 Systemic lupus erythematosus, unspecified: Secondary | ICD-10-CM | POA: Diagnosis not present

## 2023-01-27 DIAGNOSIS — M5030 Other cervical disc degeneration, unspecified cervical region: Secondary | ICD-10-CM | POA: Diagnosis not present

## 2023-01-27 DIAGNOSIS — R519 Headache, unspecified: Secondary | ICD-10-CM | POA: Diagnosis not present

## 2023-01-27 DIAGNOSIS — M79621 Pain in right upper arm: Secondary | ICD-10-CM

## 2023-01-27 DIAGNOSIS — M5010 Cervical disc disorder with radiculopathy, unspecified cervical region: Secondary | ICD-10-CM

## 2023-01-27 MED ORDER — GABAPENTIN 300 MG PO CAPS: 300.0000 mg | ORAL_CAPSULE | Freq: Every day | ORAL | 0 refills | Status: DC

## 2023-01-27 MED ORDER — PREDNISONE 50 MG PO TABS: 50.0000 mg | ORAL_TABLET | Freq: Every day | ORAL | 0 refills | Status: DC

## 2023-01-27 MED ORDER — GABAPENTIN 300 MG PO CAPS: 300.0000 mg | ORAL_CAPSULE | Freq: Every day | ORAL | 0 refills | Status: AC

## 2023-01-27 NOTE — Telephone Encounter (Signed)
Pt called requesting rx gabapentin and prednisone from UC visit today be sent to [facility] [address] [place]-done

## 2023-01-27 NOTE — ED Provider Notes (Signed)
[facility]    CSN: [id number] Arrival date & time: 01/27/23  1200      History   Chief Complaint Chief Complaint  Patient presents with  . Headache  . Arm Pain    HPI Cheryl White is a 62 y.o. female.   Pleasant 63 year old female with a known history of Sjogren's, Raynaud's, lupus presents today due to concerns of a right sided headache, neck pain, and arm pain.  She states that been present for the past 3 days.  She took Advil without relief.  She has no history of blood clots or stroke.  She states that her face and her arm feel tingly.  That just started this morning.  She denies any change in her vision, dizziness, or weakness.  She denies any change in her gait.  She does report tinnitus, but states this is chronic and unchanged from baseline.  She does have a known history of lumbar spine issues with radicular symptoms on the right for which she follows with neurology, her last appointment with them was last month.  She states the symptoms in her arm feel pretty consistent with the radicular symptoms she felt in her leg previously.  There was no head trauma or injury.  She does take alendronate for osteoporosis, Plaquenil for her lupus.  No recent dosage changes.   Headache Arm Pain Associated symptoms include headaches.    Past Medical History:  Diagnosis Date  . Lupus (HCC)   . Raynaud's disease   . Sjogren's disease [facility])     Patient Active Problem List   Diagnosis Date Noted  . Subconjunctival hemorrhage of left eye 10/31/2022  . History of thyroid nodule 09/18/2022  . History of osteoporosis 09/18/2022  . Memory difficulty 11/02/2017  . 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 Panic attacks 07/18/2017  . GERD (gastroesophageal reflux disease) 07/03/2017  . Lupus (systemic lupus erythematosus) (HCC) 05/09/2017  . Raynaud disease 05/09/2017  . Generalized anxiety disorder 05/09/2017    Past Surgical History:  Procedure Laterality Date  . BREAST SURGERY    .  CESAREAN SECTION    . TUBAL LIGATION      OB History   No obstetric history on file.      Home Medications    Prior to Admission medications   Medication Sig Start Date End Date Taking? Authorizing Provider  alendronate (FOSAMAX) 70 MG tablet Take 70 mg by mouth once a week.    [provider]  ALPRAZolam Prudy Feeler) 0.5 MG tablet for sedation before MRI scan; take 1 tab 1 hour before scan; may repeat 1 tab 15 min before scan 01/09/23   Penumalli, Glenford Bayley, MD  benzonatate (TESSALON) 100 MG capsule Take 100 mg by mouth as needed for cough.    [provider]  cetirizine (ZYRTEC) 10 MG chewable tablet Chew 10 mg by mouth daily.    [provider]  hydroxychloroquine (PLAQUENIL) 200 MG tablet Take by mouth daily.    [provider]  hydrOXYzine (ATARAX/VISTARIL) 10 MG tablet Take 1 tablet (10 mg total) by mouth 3 (three) times daily as needed. 07/18/17   Mikell, Antionette Poles, MD  omeprazole (PRILOSEC) 40 MG capsule Take 40 mg by mouth daily. 09/29/22   [provider]    Family History Family History  Problem Relation Age of Onset  . Diabetes Mother   . Diabetes Father   . Arthritis/Rheumatoid Father   . Diabetes Brother   . Heart attack Paternal Grandfather  Social History Social History   Tobacco Use  . Smoking status: Never  . Smokeless tobacco: Never  Vaping Use  . Vaping status: Never Used  Substance Use Topics  . Alcohol use: No  . Drug use: No     Allergies   Lidocaine, Other, Shrimp extract, Iodinated contrast media, and Shellfish allergy   Review of Systems Review of Systems  Neurological:  Positive for headaches.     Physical Exam Triage Vital Signs ED Triage Vitals  Encounter Vitals Group     BP 01/27/23 1209 99/61     Systolic BP Percentile --      Diastolic BP Percentile --      Pulse Rate 01/27/23 1209 67     Resp 01/27/23 1209 16     Temp 01/27/23 1209 98.4 F (36.9 C)     Temp Source 01/27/23 1209  Oral     SpO2 01/27/23 1209 96 %     Weight --      Height --      Head Circumference --      Peak Flow --      Pain Score 01/27/23 1211 8     Pain Loc --      Pain Education --      Exclude from Growth Chart --    No data found.  Updated Vital Signs BP 99/61 (BP Location: Left Arm)   Pulse 67   Temp 98.4 F (36.9 C) (Oral)   Resp 16   SpO2 96%   Visual Acuity Right Eye Distance:   Left Eye Distance:   Bilateral Distance:    Right Eye Near:   Left Eye Near:    Bilateral Near:     Physical Exam   UC Treatments / Results  Labs (all labs ordered are listed, but only abnormal results are displayed) Labs Reviewed - No data to display  EKG   Radiology No results found.  Procedures Procedures (including critical care time)  Medications Ordered in UC Medications - No data to display  Initial Impression / Assessment and Plan / UC Course  I have reviewed the triage vital signs and the nursing notes.  Pertinent labs & imaging results that were available during my care of the patient were reviewed by me and considered in my medical decision making (see chart for details).     *** Final Clinical Impressions(s) / UC Diagnoses   Final diagnoses:  None   Discharge Instructions   None    ED Prescriptions   None    PDMP not reviewed this encounter.

## 2023-01-27 NOTE — Discharge Instructions (Signed)
Sospecho que muchos de los sntomas que est experimentando estn relacionados con un brote de lupus, que puede causar sntomas neurolgicos perifricos.  Su radiografa muestra una degeneracin significativa de su columna, que tambin puede estar provocando algunos de los sntomas en su brazo. Llame a su neurlogo el lunes para programar un seguimiento de su radiografa de la columna cervical.  Me gustara que tomara prednisona una vez al da por la maana con el desayuno. Toma esto hasta que se acabe.  Tambin ped gabapentina, un medicamento que ayuda con el dolor de los nervios. Tmelo una vez al da por la noche, ya que puede provocarle sueo.  Si presenta un dolor de cabeza que [place], debilidad, prdida de movimiento en la cara, dificultad para hablar o cambios en la forma de andar, dirjase a la sala de emergencias de inmediato.     I suspect many of the symptoms you are experiencing are related to a flare of your lupus, which can cause peripheral neurological symptoms.  Your xray does show significant degeneration of your spine which may also be leading to some of the symptoms in your arm. Please call your neurologist on Monday to schedule a follow up regarding your cervical spine xray.  I would like you to take prednisone once daily in the morning with breakfast. Take this until gone.  I have also called in gabapentin, a medication that helps with nerve pain. Take this once daily at night as it can make you sleepy.  If you develop a worsening headache, weakness, loss of movement in your face, slurred speech, or change in gait, please head to the ER immediately.

## 2023-01-27 NOTE — ED Triage Notes (Signed)
Pt reports right sided headache, right arm pain and numbness in the right hand x 3 days. Advil gives no rleief.

## 2023-01-28 ENCOUNTER — Ambulatory Visit: Payer: 59

## 2023-01-30 ENCOUNTER — Ambulatory Visit
Admission: RE | Admit: 2023-01-30 | Discharge: 2023-01-30 | Disposition: A | Discharge status: Home / Self Care | Payer: 59 | Source: Ambulatory Visit | Attending: Physician Assistant | Admitting: Physician Assistant

## 2023-01-30 VITALS — BP 115/77 | HR 64 | Resp 17

## 2023-01-30 DIAGNOSIS — H10212 Acute toxic conjunctivitis, left eye: Secondary | ICD-10-CM

## 2023-01-30 MED ORDER — TETRACAINE HCL 0.5 % OP SOLN
2.0000 [drp] | Freq: Once | OPHTHALMIC | Status: AC
  Administered 2023-01-30: 2 [drp] via OPHTHALMIC

## 2023-01-30 NOTE — ED Triage Notes (Signed)
Pt presents with c/o bleach in the eye, states she feels a burning sensation in her nose and lt eye. States bleach went into her eye today.   Pt denies blurred vision.

## 2023-01-30 NOTE — Discharge Instructions (Signed)
Return if any problems. See your eye docot for recheck tomorrow

## 2023-01-30 NOTE — ED Provider Notes (Signed)
[facility]    CSN: [id number] Arrival date & time: 01/30/23  1655      History   Chief Complaint Chief Complaint  Patient presents with   Eye Problem    Bleach in eyes - Entered by patient    HPI Cheryl White is a 62 y.o. female.   Pt splashed bleach in left eye and nose.  Patient called her eye doctor and it was advised to come in for evaluation.  Reports that she rinsed and washed in the shower for 20 minutes.  The history is provided by the patient and a relative.  Eye Problem Location:  Left eye Quality:  Aching Severity:  Mild Onset quality:  Sudden Relieved by:  Nothing Worsened by:  Nothing Ineffective treatments:  None tried Associated symptoms: inflammation   Associated symptoms: no blurred vision     Past Medical History:  Diagnosis Date   Lupus (HCC)    Raynaud's disease    Sjogren's disease (HCC)     Patient Active Problem List   Diagnosis Date Noted   Subconjunctival hemorrhage of left eye 10/31/2022   History of thyroid nodule 09/18/2022   History of osteoporosis 09/18/2022   Memory difficulty 11/02/2017   Otomycosis 09/20/2017   Panic attacks 07/18/2017   GERD (gastroesophageal reflux disease) 07/03/2017   Lupus (systemic lupus erythematosus) (HCC) 05/09/2017   Raynaud disease 05/09/2017   Generalized anxiety disorder 05/09/2017    Past Surgical History:  Procedure Laterality Date   BREAST SURGERY     CESAREAN SECTION     TUBAL LIGATION      OB History   No obstetric history on file.      Home Medications    Prior to Admission medications   Medication Sig Start Date End Date Taking? Authorizing Provider  alendronate (FOSAMAX) 70 MG tablet Take 70 mg by mouth once a week.    [provider]  ALPRAZolam Prudy Feeler) 0.5 MG tablet for sedation before MRI scan; take 1 tab 1 hour before scan; may repeat 1 tab 15 min before scan 01/09/23   Penumalli, Glenford Bayley, MD  benzonatate (TESSALON) 100 MG capsule Take 100 mg by  mouth as needed for cough.    [provider]  cetirizine (ZYRTEC) 10 MG chewable tablet Chew 10 mg by mouth daily.    [provider]  gabapentin (NEURONTIN) 300 MG capsule Take 1 capsule (300 mg total) by mouth at bedtime. 01/27/23   Crain, Whitney L, PA  hydroxychloroquine (PLAQUENIL) 200 MG tablet Take by mouth daily.    [provider]  hydrOXYzine (ATARAX/VISTARIL) 10 MG tablet Take 1 tablet (10 mg total) by mouth 3 (three) times daily as needed. 07/18/17   Mikell, Antionette Poles, MD  omeprazole (PRILOSEC) 40 MG capsule Take 40 mg by mouth daily. 09/29/22   [provider]  predniSONE (DELTASONE) 50 MG tablet Take 1 tablet (50 mg total) by mouth daily with breakfast. 01/27/23   Maretta Bees, PA    Family History Family History  Problem Relation Age of Onset   Diabetes Mother    Diabetes Father    Arthritis/Rheumatoid Father    Diabetes Brother    Heart attack Paternal Grandfather     Social History Social History   Tobacco Use   Smoking status: Never   Smokeless tobacco: Never  Vaping Use   Vaping status: Never Used  Substance Use Topics   Alcohol use: No   Drug use: No  Allergies   Lidocaine, Other, Shrimp extract, Iodinated contrast media, and Shellfish allergy   Review of Systems Review of Systems  Eyes:  Negative for blurred vision.  All other systems reviewed and are negative.    Physical Exam Triage Vital Signs ED Triage Vitals [01/30/23 1722]  Encounter Vitals Group     BP 115/77     Systolic BP Percentile      Diastolic BP Percentile      Pulse Rate 64     Resp 17     Temp      Temp Source Oral     SpO2 98 %     Weight      Height      Head Circumference      Peak Flow      Pain Score 5     Pain Loc      Pain Education      Exclude from Growth Chart    No data found.  Updated Vital Signs BP 115/77 (BP Location: Right Arm)   Pulse 64   Resp 17   SpO2 98%   Visual Acuity Right Eye Distance:    Left Eye Distance:   Bilateral Distance:    Right Eye Near:   Left Eye Near:    Bilateral Near:     Physical Exam Vitals and nursing note reviewed.  Constitutional:      Appearance: She is well-developed.  HENT:     Head: Normocephalic.     Nose: Nose normal.     Mouth/Throat:     Mouth: Mucous membranes are moist.  Eyes:     Extraocular Movements: Extraocular movements intact.     Pupils: Pupils are equal, round, and reactive to light.     Comments: Injected left conjunctiva  Cardiovascular:     Rate and Rhythm: Normal rate.  Pulmonary:     Effort: Pulmonary effort is normal.  Abdominal:     General: There is no distension.  Musculoskeletal:        General: Normal range of motion.     Cervical back: Normal range of motion.  Skin:    General: Skin is warm.  Neurological:     General: No focal deficit present.     Mental Status: She is alert and oriented to person, place, and time.      UC Treatments / Results  Labs (all labs ordered are listed, but only abnormal results are displayed) Labs Reviewed - No data to display  EKG   Radiology No results found.  Procedures Procedures (including critical care time)  Medications Ordered in UC Medications  tetracaine (PONTOCAINE) 0.5 % ophthalmic solution 2 drop (2 drops Both Eyes Given 01/30/23 1739)    Initial Impression / Assessment and Plan / UC Course  I have reviewed the triage vital signs and the nursing notes.  Pertinent labs & imaging results that were available during my care of the patient were reviewed by me and considered in my medical decision making (see chart for details).     pH is normal I irrigated left eye.  No sign of significant injury.  Patient advised to follow-up with her ophthalmologist tomorrow for recheck return if any problems. Final Clinical Impressions(s) / UC Diagnoses   Final diagnoses:  Chemical conjunctivitis of left eye     Discharge Instructions      Return if any  problems. See your eye docot for recheck tomorrow    ED Prescriptions   None  PDMP not reviewed this encounter. An After Visit Summary was printed and given to the patient.       Elson Areas, [place] 01/30/23 1747

## 2023-02-05 ENCOUNTER — Ambulatory Visit
Admission: RE | Admit: 2023-02-05 | Discharge: 2023-02-05 | Disposition: A | Discharge status: Home / Self Care | Payer: 59 | Source: Ambulatory Visit | Attending: Diagnostic Neuroimaging | Admitting: Diagnostic Neuroimaging

## 2023-02-05 DIAGNOSIS — M5416 Radiculopathy, lumbar region: Secondary | ICD-10-CM | POA: Diagnosis not present

## 2023-02-06 ENCOUNTER — Ambulatory Visit (INDEPENDENT_AMBULATORY_CARE_PROVIDER_SITE_OTHER): Payer: 59 | Admitting: Emergency Medicine

## 2023-02-06 ENCOUNTER — Encounter: Payer: Self-pay | Admitting: Emergency Medicine

## 2023-02-06 VITALS — BP 102/68 | HR 59 | Temp 98.0°F | Ht 61.0 in | Wt 114.1 lb

## 2023-02-06 DIAGNOSIS — R29818 Other symptoms and signs involving the nervous system: Secondary | ICD-10-CM

## 2023-02-06 DIAGNOSIS — M329 Systemic lupus erythematosus, unspecified: Secondary | ICD-10-CM | POA: Diagnosis not present

## 2023-02-06 DIAGNOSIS — F411 Generalized anxiety disorder: Secondary | ICD-10-CM | POA: Diagnosis not present

## 2023-02-06 MED ORDER — CICLOPIROX 8 % EX SOLN: Freq: Every day | CUTANEOUS | 3 refills | Status: DC

## 2023-02-06 NOTE — Patient Instructions (Signed)

## 2023-02-06 NOTE — Assessment & Plan Note (Signed)
Stable and well controlled. 

## 2023-02-06 NOTE — Assessment & Plan Note (Signed)
Sudden awakenings with tachycardia and occasional morning headaches Snores a little bit Recommend sleep studies to rule out sleep apnea Referral placed today

## 2023-02-06 NOTE — Assessment & Plan Note (Signed)
Clinically stable. Continues Plaquenil 200 mg daily Has symptoms of Sjogren's disease No complications

## 2023-02-06 NOTE — Progress Notes (Signed)
[facility] 62 y.o.   Chief Complaint  Patient presents with   Medical Management of Chronic Issues    f/u appt, patient states when she sleeps at night she wakes up with her heart beating fast    HISTORY OF PRESENT ILLNESS: This is a 62 y.o. female A1A here for 41-month follow-up for chronic medical conditions History of lupus, on Plaquenil.  Also symptoms of Sjogren's. Sometimes when she sleeps at night she wakes up with heart beating fast.  Occasional morning headaches. No other complaints or medical concerns today.  HPI   Prior to Admission medications   Medication Sig Start Date End Date Taking? Authorizing Provider  albuterol (VENTOLIN HFA) 108 (90 Base) MCG/ACT inhaler Inhale into the lungs. 09/29/22  Yes [provider]  alendronate (FOSAMAX) 70 MG tablet Take 70 mg by mouth once a week.   Yes [provider]  benzonatate (TESSALON) 100 MG capsule Take 100 mg by mouth as needed for cough.   Yes [provider]  fluticasone-salmeterol (ADVAIR) 250-50 MCG/ACT AEPB Inhale 1 puff into the lungs 2 (two) times daily. 09/29/22  Yes [provider]  hydroxychloroquine (PLAQUENIL) 200 MG tablet Take by mouth daily.   Yes [provider]  ALPRAZolam Prudy Feeler) 0.5 MG tablet for sedation before MRI scan; take 1 tab 1 hour before scan; may repeat 1 tab 15 min before scan Patient not taking: Reported on 02/06/2023 01/09/23   Penumalli, Glenford Bayley, MD  cetirizine (ZYRTEC) 10 MG chewable tablet Chew 10 mg by mouth daily. Patient not taking: Reported on 02/06/2023    [provider]  gabapentin (NEURONTIN) 300 MG capsule Take 1 capsule (300 mg total) by mouth at bedtime. Patient not taking: Reported on 02/06/2023 01/27/23   Guy Sandifer L, PA  hydrOXYzine (ATARAX/VISTARIL) 10 MG tablet Take 1 tablet (10 mg total) by mouth 3 (three) times daily as needed. Patient not taking: Reported on 02/06/2023 07/18/17   Berton Bon, MD  omeprazole  (PRILOSEC) 40 MG capsule Take 40 mg by mouth daily. Patient not taking: Reported on 02/06/2023 09/29/22   [provider]  predniSONE (DELTASONE) 50 MG tablet Take 1 tablet (50 mg total) by mouth daily with breakfast. Patient not taking: Reported on 02/06/2023 01/27/23   Guy Sandifer L, PA    Allergies  Allergen Reactions   Lidocaine Shortness Of Breath    Allergy actually to Septocaine, not Lidocaine. Palpation and SOB   Other Anaphylaxis   Shrimp Extract Anaphylaxis   Iodinated Contrast Media Other (See Comments) and Rash   Shellfish Allergy     Patient Active Problem List   Diagnosis Date Noted   History of thyroid nodule 09/18/2022   History of osteoporosis 09/18/2022   Memory difficulty 11/02/2017   Panic attacks 07/18/2017   GERD (gastroesophageal reflux disease) 07/03/2017   Lupus (systemic lupus erythematosus) (HCC) 05/09/2017   Raynaud disease 05/09/2017   Generalized anxiety disorder 05/09/2017    Past Medical History:  Diagnosis Date   Lupus (HCC)    Raynaud's disease    Sjogren's disease (HCC)     Past Surgical History:  Procedure Laterality Date   BREAST SURGERY     CESAREAN SECTION     TUBAL LIGATION      Social History   Socioeconomic History   Marital status: Divorced    Spouse name: Not on file   Number of children: Not on file   Years of education: Not on file   Highest education  level: Not on file  Occupational History   Occupation: Cleaning houses  Tobacco Use   Smoking status: Never   Smokeless tobacco: Never  Vaping Use   Vaping status: Never Used  Substance and Sexual Activity   Alcohol use: No   Drug use: No   Sexual activity: Not Currently    Comment: Has been Divorced for 12 years   Other Topics Concern   Not on file  Social History Narrative   Patient divorced 12 years. Patient had three children - two boys and 1 girl in there 21s- 30s. Eldest is  studying education in [place] - getting PHD.    Patient lives on her own  in [place].    Social Determinants of Health   Financial Resource Strain: Not on file  Food Insecurity: Low Risk  (01/25/2023)   Received from [facility]   Food vital sign    Within the past 12 months, you worried that your food would run out before you got money to buy more: Never true    Within the past 12 months, the food you bought just didn't last and you didn't have money to get more. : Never true  Transportation Needs: Not on file (01/25/2023)  Physical Activity: Not on file  Stress: Not on file  Social Connections: Not on file  Intimate Partner Violence: Not on file    Family History  Problem Relation Age of Onset   Diabetes Mother    Diabetes Father    Arthritis/Rheumatoid Father    Diabetes Brother    Heart attack Paternal Grandfather      Review of Systems  Constitutional: Negative.  Negative for chills and fever.  HENT: Negative.  Negative for congestion and sore throat.   Respiratory: Negative.  Negative for cough and shortness of breath.   Cardiovascular:  Positive for palpitations. Negative for chest pain.  Gastrointestinal:  Negative for abdominal pain, nausea and vomiting.  Genitourinary: Negative.  Negative for dysuria and hematuria.  Skin: Negative.  Negative for rash.  Neurological: Negative.  Negative for dizziness and headaches.  All other systems reviewed and are negative.   Vitals:   02/06/23 0834  BP: 102/68  Pulse: (!) 59  Temp: 98 F (36.7 C)  SpO2: 98%    Physical Exam Vitals reviewed.  Constitutional:      Appearance: Normal appearance.  HENT:     Head: Normocephalic.     Mouth/Throat:     Mouth: Mucous membranes are moist.     Pharynx: Oropharynx is clear.  Eyes:     Extraocular Movements: Extraocular movements intact.     Conjunctiva/sclera: Conjunctivae normal.     Pupils: Pupils are equal, round, and reactive to light.  Cardiovascular:     Rate and Rhythm: Normal rate and regular rhythm.     Pulses: Normal pulses.      Heart sounds: Normal heart sounds.  Pulmonary:     Effort: Pulmonary effort is normal.     Breath sounds: Normal breath sounds.  Musculoskeletal:     Cervical back: No tenderness.  Lymphadenopathy:     Cervical: No cervical adenopathy.  Skin:    General: Skin is warm and dry.     Capillary Refill: Capillary refill takes less than 2 seconds.  Neurological:     General: No focal deficit present.     Mental Status: She is alert and oriented to person, place, and time.  Psychiatric:        Mood and  Affect: Mood normal.        Behavior: Behavior normal.      ASSESSMENT & PLAN: A total of 44 minutes was spent with the patient and counseling/coordination of care regarding preparing for this visit, review of most recent office visit notes, review of multiple chronic medical conditions under management, review of all medications, review of most recent blood work results, need for sleep apnea studies, prognosis, education on nutrition, documentation and need for follow-up.  Problem List Items Addressed This Visit       Other   Lupus (systemic lupus erythematosus) (HCC) - Primary    Clinically stable. Continues Plaquenil 200 mg daily Has symptoms of Sjogren's disease No complications      Generalized anxiety disorder    Stable and well-controlled.      Suspected sleep apnea    Sudden awakenings with tachycardia and occasional morning headaches Snores a little bit Recommend sleep studies to rule out sleep apnea Referral placed today      Relevant Orders   Ambulatory referral to Sleep Studies   Patient Instructions  Mantenimiento de la salud en las mujeres Health Maintenance, Female Adoptar un estilo de vida saludable y recibir atencin preventiva son importantes para promover la salud y Counsellor. Consulte al mdico sobre: El esquema adecuado para hacerse pruebas y exmenes peridicos. Cosas que puede hacer por su cuenta para prevenir enfermedades y [place]  sano. Qu debo saber sobre la dieta, el peso y el ejercicio? Consuma una dieta saludable  Consuma una dieta que incluya muchas verduras, frutas, productos lcteos con bajo contenido de [place] y Associate Professor. No consuma muchos alimentos ricos en grasas slidas, azcares agregados o sodio. Mantenga un peso saludable El ndice de masa muscular [facility]) se [place] para identificar problemas de [place]. Proporciona una estimacin de la grasa corporal basndose en el peso y la altura. Su mdico puede ayudarle a Engineer, site [facility] y a Personnel officer o Pharmacologist un peso saludable. Haga ejercicio con regularidad Haga ejercicio con regularidad. Esta es una de las prcticas ms importantes que puede hacer por su salud. La [employer] de los adultos deben seguir estas pautas: Education officer, environmental, al menos, 150 minutos de actividad fsica por semana. El ejercicio debe aumentar la frecuencia cardaca y Media planner transpirar (ejercicio de intensidad moderada). Hacer ejercicios de fortalecimiento por lo [employer] por semana. Agregue esto a su plan de ejercicio de intensidad moderada. Pase menos tiempo sentada. Incluso la actividad fsica ligera puede ser beneficiosa. Controle sus niveles de colesterol y lpidos en la sangre Comience a realizarse anlisis de lpidos y Oncologist en la sangre a los 20 aos y luego reptalos cada 5 aos. Hgase controlar los niveles de colesterol con mayor frecuencia si: Sus niveles de lpidos y colesterol son altos. Es mayor de 40 aos. Presenta un alto riesgo de padecer enfermedades cardacas. Qu debo saber sobre las pruebas de deteccin del cncer? Segn su historia clnica y sus antecedentes familiares, es posible que deba realizarse pruebas de deteccin del cncer en diferentes edades. Esto puede incluir pruebas de deteccin de lo siguiente: Cncer de mama. Cncer de cuello uterino. Cncer colorrectal. Cncer de piel. Cncer de pulmn. Qu debo saber sobre la enfermedad cardaca, la diabetes  y la hipertensin arterial? Presin arterial y enfermedad cardaca La hipertensin arterial causa enfermedades cardacas y [place] el riesgo de accidente cerebrovascular. Es ms probable que esto se manifieste en las personas que tienen lecturas de presin arterial alta o tienen sobrepeso. Hgase controlar la presin arterial: Cada 3  a 5 aos si tiene entre 18 y 53 aos. Todos los aos si es mayor de 40 aos. Diabetes Realcese exmenes de deteccin de la diabetes con regularidad. Este anlisis revisa el nivel de azcar en la sangre en [place]. Hgase las pruebas de deteccin: Cada tres aos despus de los 40 aos de edad si tiene un peso normal y un bajo riesgo de padecer diabetes. Con ms frecuencia y a partir de [place] edad inferior si tiene sobrepeso o un alto riesgo de padecer diabetes. Qu debo saber sobre la prevencin de infecciones? Hepatitis B Si tiene un riesgo ms alto de contraer hepatitis B, debe someterse a un examen de deteccin de este virus. Hable con el mdico para averiguar si tiene riesgo de contraer la infeccin por hepatitis B. Hepatitis C Se recomienda el anlisis a: [employer] 1945 y 1965. Todas las personas que tengan un riesgo de haber contrado hepatitis C. Enfermedades de transmisin sexual (ETS) Hgase las pruebas de Airline pilot de ITS, incluidas la gonorrea y la clamidia, si: Es sexualmente activa y es menor de [address]. Es mayor de [address], y Public affairs consultant informa que corre riesgo de tener este tipo de infecciones. La actividad sexual ha cambiado desde que le hicieron la ltima prueba de deteccin y tiene un riesgo mayor de Warehouse manager clamidia o Copy. Pregntele al mdico si usted tiene riesgo. Pregntele al mdico si usted tiene un alto riesgo de Primary school teacher VIH. El mdico tambin puede recomendarle un medicamento recetado para ayudar a evitar la infeccin por el VIH. Si elige tomar medicamentos para prevenir el VIH, primero debe [employer] de  deteccin del VIH. Luego debe hacerse anlisis cada 3 meses mientras est tomando los medicamentos. Embarazo Si est por dejar de Armed forces training and education officer (fase premenopusica) y usted puede quedar [place], busque asesoramiento antes de [place]. Tome de 400 a 800 microgramos (mcg) de cido [employer] si [place]. Pida mtodos de control de la natalidad (anticonceptivos) si desea evitar un embarazo no deseado. Osteoporosis y [place] La osteoporosis es una enfermedad en la que los huesos pierden los minerales y la fuerza por el avance de la edad. El resultado pueden ser fracturas en los [place]. Si tiene 65 aos o ms, o si est en riesgo de sufrir osteoporosis y fracturas, pregunte a su mdico si debe: Hacerse pruebas de deteccin de prdida sea. Tomar un suplemento de calcio o de vitamina D para reducir el riesgo de fracturas. Recibir terapia de reemplazo hormonal (TRH) para tratar los sntomas de la menopausia. Siga estas indicaciones en su casa: Consumo de alcohol No beba alcohol si: Su mdico le indica no hacerlo. Est embarazada, puede estar embarazada o est tratando de [place]. Si bebe alcohol: Limite la cantidad que bebe a lo siguiente: De 0 a 1 bebida por da. Sepa cunta cantidad de alcohol hay en las bebidas que toma. En los [address], una medida equivale a una botella de cerveza de 12 oz (355 ml), un vaso de vino de 5 oz (148 ml) o un vaso de una bebida alcohlica de alta graduacin de 1 oz (44 ml). Estilo de vida No consuma ningn producto que contenga nicotina o tabaco. Estos productos incluyen cigarrillos, tabaco para Theatre manager y aparatos de vapeo, como los Administrator, Civil Service. Si necesita ayuda para dejar de consumir estos productos, consulte al mdico. No consuma drogas. No comparta agujas. Solicite ayuda a su mdico si necesita apoyo o informacin para abandonar las drogas. Indicaciones generales  Realcese los estudios de rutina de la  salud, dentales y de Wellsite geologist. Mantngase al da con las vacunas. Infrmele a su mdico si: Se siente deprimida con frecuencia. Alguna vez ha sido vctima de [place] o no se siente seguro en su casa. Resumen Adoptar un estilo de vida saludable y recibir atencin preventiva son importantes para promover la salud y Counsellor. Siga las instrucciones del mdico acerca de una dieta saludable, el ejercicio y la realizacin de pruebas o exmenes para Hotel manager. Siga las instrucciones del mdico con respecto al control del colesterol y la presin arterial. Esta informacin no tiene Theme park manager el consejo del mdico. Asegrese de hacerle al mdico cualquier pregunta que tenga. Document Revised: 12/02/2020 Document Reviewed: 12/02/2020 Elsevier Patient Education  2024 Elsevier Inc.     Edwina Barth, MD [facility]

## 2023-02-27 ENCOUNTER — Telehealth: Payer: Self-pay

## 2023-02-27 NOTE — Telephone Encounter (Signed)
Patient and daughter Cheryl White (on [place]) called to get results of her MRI completed 7/29. Once resulted, please call patients daughter to discuss at [phone].

## 2023-03-01 ENCOUNTER — Telehealth: Payer: Self-pay | Admitting: Anesthesiology

## 2023-03-01 DIAGNOSIS — M5416 Radiculopathy, lumbar region: Secondary | ICD-10-CM

## 2023-03-01 NOTE — Telephone Encounter (Signed)
-----   Message from Glenford Bayley [facility] sent at 02/28/2023  5:07 PM EDT ----- - multi-level arthritis changes - consider referral to pain mgmt for epidural injection, medication, PT exercises

## 2023-03-01 NOTE — Telephone Encounter (Signed)
Called pt and informed of MRI results. All information was provided to pt in Spanish. Advised pt she will be getting a call from PT and pain mgmt to get her scheduled. Pt verbalized understanding. Pt had no additional questions at this time but was encouraged to call back if any questions arise.

## 2023-03-05 ENCOUNTER — Telehealth: Payer: Self-pay | Admitting: Diagnostic Neuroimaging

## 2023-03-05 NOTE — Telephone Encounter (Signed)
 Referral for pain clinic fax to [facility]. Phone: [phone], Fax: [phone]

## 2023-03-08 ENCOUNTER — Ambulatory Visit: Payer: 59

## 2023-03-09 ENCOUNTER — Ambulatory Visit (INDEPENDENT_AMBULATORY_CARE_PROVIDER_SITE_OTHER): Payer: 59 | Admitting: Family Medicine

## 2023-03-09 ENCOUNTER — Encounter: Payer: Self-pay | Admitting: Family Medicine

## 2023-03-09 VITALS — BP 102/66 | HR 67 | Temp 97.8°F | Ht 61.0 in | Wt 116.0 lb

## 2023-03-09 DIAGNOSIS — I7300 Raynaud's syndrome without gangrene: Secondary | ICD-10-CM

## 2023-03-09 DIAGNOSIS — M329 Systemic lupus erythematosus, unspecified: Secondary | ICD-10-CM | POA: Diagnosis not present

## 2023-03-09 DIAGNOSIS — G514 Facial myokymia: Secondary | ICD-10-CM | POA: Diagnosis not present

## 2023-03-09 DIAGNOSIS — R202 Paresthesia of skin: Secondary | ICD-10-CM | POA: Diagnosis not present

## 2023-03-09 LAB — CBC WITH DIFFERENTIAL/PLATELET
Basophils Absolute: 0 10*3/uL (ref 0.0–0.1)
Basophils Relative: 0.4 % (ref 0.0–3.0)
Eosinophils Absolute: 0.1 10*3/uL (ref 0.0–0.7)
Eosinophils Relative: 1.3 % (ref 0.0–5.0)
HCT: 42.4 % (ref 36.0–46.0)
Hemoglobin: 13.6 g/dL (ref 12.0–15.0)
Lymphocytes Relative: 29.3 % (ref 12.0–46.0)
Lymphs Abs: 1.3 10*3/uL (ref 0.7–4.0)
MCHC: 32.1 g/dL (ref 30.0–36.0)
MCV: 84 fl (ref 78.0–100.0)
Monocytes Absolute: 0.3 10*3/uL (ref 0.1–1.0)
Monocytes Relative: 7.4 % (ref 3.0–12.0)
Neutro Abs: 2.8 10*3/uL (ref 1.4–7.7)
Neutrophils Relative %: 61.6 % (ref 43.0–77.0)
Platelets: 194 10*3/uL (ref 150.0–400.0)
RBC: 5.05 Mil/uL (ref 3.87–5.11)
RDW: 13.1 % (ref 11.5–15.5)
WBC: 4.6 10*3/uL (ref 4.0–10.5)

## 2023-03-09 LAB — COMPREHENSIVE METABOLIC PANEL
ALT: 19 U/L (ref 0–35)
AST: 20 U/L (ref 0–37)
Albumin: 4.4 g/dL (ref 3.5–5.2)
Alkaline Phosphatase: 112 U/L (ref 39–117)
BUN: 17 mg/dL (ref 6–23)
CO2: 27 mEq/L (ref 19–32)
Calcium: 9.5 mg/dL (ref 8.4–10.5)
Chloride: 104 mEq/L (ref 96–112)
Creatinine, Ser: 0.75 mg/dL (ref 0.40–1.20)
GFR: 85.89 mL/min (ref >60.00)
Glucose, Bld: 88 mg/dL (ref 70–99)
Potassium: 3.7 mEq/L (ref 3.5–5.1)
Sodium: 140 mEq/L (ref 135–145)
Total Bilirubin: 0.3 mg/dL (ref 0.2–1.2)
Total Protein: 7.3 g/dL (ref 6.0–8.3)

## 2023-03-09 LAB — VITAMIN B12: Vitamin B-12: 828 pg/mL (ref 211–911)

## 2023-03-09 LAB — T4, FREE: Free T4: 1 ng/dL (ref 0.60–1.60)

## 2023-03-09 LAB — TSH: TSH: 0.8 u[IU]/mL (ref 0.35–5.50)

## 2023-03-09 LAB — MAGNESIUM: Magnesium: 2 mg/dL (ref 1.5–2.5)

## 2023-03-09 NOTE — Patient Instructions (Addendum)
Please go downstairs for labs before you leave.  I recommend calling and scheduling appointment with your neurologist regarding facial twitching and current symptoms.  Will be in touch with your lab results

## 2023-03-09 NOTE — Progress Notes (Signed)
Subjective:     Patient ID: Cheryl White, female    DOB: 23-Nov-1960, 62 y.o.   MRN: [id number]  Chief Complaint  Patient presents with   Face spasm    Face and bilateral eye trembling/twitching for a month and a half, getting a lot of headaches and nausea with it. Liquid dripping from ear    HPI  Discussed the use of AI scribe software for clinical note transcription with the patient, who gave verbal consent to proceed.  History of Present Illness         Her daughter is interpreting for her.   C/o 6 week hx of facial twitching. States twitching and tingling daily, worsening.  No weakness.   Visit to UC for the same and was prescribed prednisone and gabapentin but did not try these.   She has allergies.   Followed by neurology for SLE    Health Maintenance Due  Topic Date Due   Zoster Vaccines- Shingrix (1 of 2) Never done   Medicare Annual Wellness (AWV)  09/22/2022    Past Medical History:  Diagnosis Date   Lupus (HCC)    Raynaud's disease    Sjogren's disease (HCC)     Past Surgical History:  Procedure Laterality Date   BREAST SURGERY     CESAREAN SECTION     TUBAL LIGATION      Family History  Problem Relation Age of Onset   Diabetes Mother    Diabetes Father    Arthritis/Rheumatoid Father    Diabetes Brother    Heart attack Paternal Grandfather     Social History   Socioeconomic History   Marital status: Divorced    Spouse name: Not on file   Number of children: Not on file   Years of education: Not on file   Highest education level: Not on file  Occupational History   Occupation: Cleaning houses  Tobacco Use   Smoking status: Never   Smokeless tobacco: Never  Vaping Use   Vaping status: Never Used  Substance and Sexual Activity   Alcohol use: No   Drug use: No   Sexual activity: Not Currently    Comment: Has been Divorced for 12 years   Other Topics Concern   Not on file  Social History Narrative   Patient divorced 12 years.  Patient had three children - two boys and 1 girl in there 31s- 30s. Eldest is  studying education in [place] - getting PHD.    Patient lives on her own in [place].    Social Determinants of Health   Financial Resource Strain: Not on file  Food Insecurity: Low Risk  (01/25/2023)   Received from [facility]   Hunger Vital Sign    Worried About Running Out of Food in the Last Year: Never true    Ran Out of Food in the Last Year: Never true  Transportation Needs: Not on file (01/25/2023)  Physical Activity: Not on file  Stress: Not on file  Social Connections: Not on file  Intimate Partner Violence: Not on file    Outpatient Medications Prior to Visit  Medication Sig Dispense Refill   albuterol (VENTOLIN HFA) 108 (90 Base) MCG/ACT inhaler Inhale into the lungs.     alendronate (FOSAMAX) 70 MG tablet Take 70 mg by mouth once a week.     ciclopirox (PENLAC) 8 % solution Apply topically at bedtime. Apply over nail and surrounding skin. Apply daily over previous coat. After seven (7) days,  may remove with alcohol and continue cycle. 6.6 mL 3   fluticasone-salmeterol (ADVAIR) 250-50 MCG/ACT AEPB Inhale 1 puff into the lungs 2 (two) times daily.     hydroxychloroquine (PLAQUENIL) 200 MG tablet Take by mouth daily.     ALPRAZolam (XANAX) 0.5 MG tablet for sedation before MRI scan; take 1 tab 1 hour before scan; may repeat 1 tab 15 min before scan (Patient not taking: Reported on 02/06/2023) 3 tablet 0   benzonatate (TESSALON) 100 MG capsule Take 100 mg by mouth as needed for cough. (Patient not taking: Reported on 03/09/2023)     cetirizine (ZYRTEC) 10 MG chewable tablet Chew 10 mg by mouth daily. (Patient not taking: Reported on 02/06/2023)     gabapentin (NEURONTIN) 300 MG capsule Take 1 capsule (300 mg total) by mouth at bedtime. (Patient not taking: Reported on 02/06/2023) 15 capsule 0   hydrOXYzine (ATARAX/VISTARIL) 10 MG tablet Take 1 tablet (10 mg total) by mouth 3 (three) times daily as  needed. (Patient not taking: Reported on 02/06/2023) 30 tablet 0   omeprazole (PRILOSEC) 40 MG capsule Take 40 mg by mouth daily. (Patient not taking: Reported on 02/06/2023)     predniSONE (DELTASONE) 50 MG tablet Take 1 tablet (50 mg total) by mouth daily with breakfast. (Patient not taking: Reported on 02/06/2023) 6 tablet 0   No facility-administered medications prior to visit.    Allergies  Allergen Reactions   Lidocaine Shortness Of Breath    Allergy actually to Septocaine, not Lidocaine. Palpation and SOB   Other Anaphylaxis   Shrimp Extract Anaphylaxis   Iodinated Contrast Media Other (See Comments) and Rash   Shellfish Allergy     Review of Systems  Constitutional:  Negative for chills, fever and malaise/fatigue.  HENT:  Positive for congestion. Negative for ear pain, sore throat and tinnitus.   Eyes:  Negative for blurred vision, double vision and photophobia.  Respiratory:  Negative for shortness of breath.   Cardiovascular:  Negative for chest pain, palpitations and leg swelling.  Gastrointestinal:  Positive for vomiting. Negative for abdominal pain, constipation, diarrhea and nausea.  Genitourinary:  Negative for dysuria, frequency and urgency.  Musculoskeletal:  Negative for falls and joint pain.  Skin:  Negative for itching and rash.  Neurological:  Positive for tingling and headaches. Negative for dizziness, tremors, speech change and focal weakness.       Objective:    Physical Exam Constitutional:      General: She is not in acute distress.    Appearance: She is not ill-appearing.  HENT:     Head: Atraumatic.     Right Ear: Tympanic membrane and ear canal normal.     Left Ear: Tympanic membrane and ear canal normal.     Mouth/Throat:     Mouth: Mucous membranes are moist.     Pharynx: Oropharynx is clear.  Eyes:     General: Lids are normal. No visual field deficit.    Extraocular Movements: Extraocular movements intact.     Right eye: No nystagmus.      Left eye: No nystagmus.     Conjunctiva/sclera: Conjunctivae normal.  Cardiovascular:     Rate and Rhythm: Normal rate and regular rhythm.  Pulmonary:     Effort: Pulmonary effort is normal.     Breath sounds: Normal breath sounds.  Musculoskeletal:     Cervical back: Normal range of motion and neck supple.     Right lower leg: No edema.     Left  lower leg: No edema.  Skin:    General: Skin is warm and dry.     Findings: No rash.  Neurological:     General: No focal deficit present.     Mental Status: She is alert and oriented to person, place, and time.     Cranial Nerves: Cranial nerves 2-12 are intact. No facial asymmetry.     Sensory: Sensation is intact. No sensory deficit.     Motor: Motor function is intact. No weakness, tremor or pronator drift.     Coordination: Romberg sign negative. Coordination normal.     Gait: Gait is intact.  Psychiatric:        Mood and Affect: Mood normal.        Behavior: Behavior normal.        Thought Content: Thought content normal.      BP 102/66 (BP Location: Left Arm, Patient Position: Sitting, Cuff Size: Large)   Pulse 67   Temp 97.8 F (36.6 C) (Temporal)   Ht 5\' 1"  (1.549 m)   Wt 116 lb (52.6 kg)   SpO2 98%   BMI 21.92 kg/m  Wt Readings from Last 3 Encounters:  03/09/23 116 lb (52.6 kg)  02/06/23 114 lb 2 oz (51.8 kg)  01/02/23 113 lb 12.8 oz (51.6 kg)       Assessment & Plan:   Problem List Items Addressed This Visit       Cardiovascular and Mediastinum   Raynaud disease     Other   Lupus (systemic lupus erythematosus) (HCC)   Other Visit Diagnoses     Facial twitching    -  Primary   Relevant Orders   CBC with Differential/Platelet (Completed)   Comprehensive metabolic panel (Completed)   TSH (Completed)   T4, free (Completed)   Vitamin B12 (Completed)   Magnesium (Completed)   Iron, TIBC and Ferritin Panel   Paresthesias       Relevant Orders   CBC with Differential/Platelet (Completed)    Comprehensive metabolic panel (Completed)   TSH (Completed)   T4, free (Completed)   Vitamin B12 (Completed)   Magnesium (Completed)   Iron, TIBC and Ferritin Panel      Here with complaints of a 6-week history of facial twitching and paresthesias. No tremor noted.  Benign neurological exam. Reviewed notes and results from specialists  Check labs and refer back to her neurologist who sees her for SLE  I am having Cheryl White maintain her hydrOXYzine, hydroxychloroquine, alendronate, benzonatate, cetirizine, omeprazole, ALPRAZolam, predniSONE, gabapentin, fluticasone-salmeterol, albuterol, and ciclopirox.  No orders of the defined types were placed in this encounter.

## 2023-03-10 LAB — IRON,TIBC AND FERRITIN PANEL
%SAT: 31 % (ref 16–45)
Ferritin: 54 ng/mL (ref 16–288)
Iron: 98 ug/dL (ref 45–160)
TIBC: 313 ug/dL (ref 250–450)

## 2023-03-11 ENCOUNTER — Ambulatory Visit
Admission: RE | Admit: 2023-03-11 | Discharge: 2023-03-11 | Disposition: A | Discharge status: Home / Self Care | Payer: 59 | Source: Ambulatory Visit | Attending: Internal Medicine | Admitting: Internal Medicine

## 2023-03-11 VITALS — BP 104/67 | HR 63 | Temp 98.1°F | Resp 20

## 2023-03-11 DIAGNOSIS — Z1152 Encounter for screening for COVID-19: Secondary | ICD-10-CM | POA: Insufficient documentation

## 2023-03-11 DIAGNOSIS — J4530 Mild persistent asthma, uncomplicated: Secondary | ICD-10-CM | POA: Diagnosis not present

## 2023-03-11 DIAGNOSIS — B349 Viral infection, unspecified: Secondary | ICD-10-CM | POA: Insufficient documentation

## 2023-03-11 MED ORDER — PROMETHAZINE-DM 6.25-15 MG/5ML PO SYRP
5.0000 mL | ORAL_SOLUTION | Freq: Three times a day (TID) | ORAL | 0 refills | Status: AC | PRN
Start: 2023-03-11 — End: ?

## 2023-03-11 MED ORDER — DEXAMETHASONE SODIUM PHOSPHATE 10 MG/ML IJ SOLN
10.0000 mg | Freq: Once | INTRAMUSCULAR | Status: AC
  Administered 2023-03-11: 10 mg via INTRAMUSCULAR

## 2023-03-11 MED ORDER — CETIRIZINE HCL 10 MG PO TABS
10.0000 mg | ORAL_TABLET | Freq: Every day | ORAL | 0 refills | Status: AC
Start: 2023-03-11 — End: ?

## 2023-03-11 MED ORDER — PSEUDOEPHEDRINE HCL 30 MG PO TABS: 30.0000 mg | ORAL_TABLET | Freq: Three times a day (TID) | ORAL | 0 refills | Status: DC | PRN

## 2023-03-11 NOTE — Discharge Instructions (Addendum)
Le notificaremos los [place] de su prueba a medida que lleguen y pueden demorar aproximadamente 24 horas.  Le recomiendo que se Field seismologist si an no lo ha Art therapist, ya que esta puede ser la forma ms fcil de [employer] en lnea o mediante una aplicacin telefnica.  Generalmente, solo nos comunicamos con usted si el resultado de la prueba es positivo.  Mientras tanto, si desarrolla sntomas que empeoran, como fiebre, dolor en el pecho o dificultad para respirar a pesar de [place] plan de tratamiento actual, informe a la sala de emergencias, ya que esto puede ser un signo de empeoramiento del [place] debido a una posible infeccin viral.  Tommi Rumps lo contrario, lo manejaremos como un sndrome viral. Para el dolor de garganta o la tos, intente Chemical engineer un t a base de miel. Utilice 3 cucharaditas de miel con jugo exprimido de [employer]. Coloque los trozos de Teacher, adult education en 1/2-1 taza de agua y calintelos sobre la estufa. Luego mezcle los ingredientes y repita cada 4 horas segn sea necesario. Tome Tylenol 650 mg cada 6 horas para dolores y [place] y [place]. Hidrtate muy bien con al menos 2 litros de [place]. Consuma comidas ligeras, como sopas para reponer electrolitos y frutas y verduras blandas. Comience con un antihistamnico como Zyrtec (10 mg al da) para el drenaje posnasal y la congestin de los senos nasales.  Puede tomarlo junto con pseudoefedrina (Sudafed) en una dosis de 30 mg 2 o 3 veces al da, segn sea necesario para el mismo tipo de congestin.  Utilice los medicamentos para la tos segn sea necesario.   We will notify you of your test results as they arrive and may take between about 24 hours.  I encourage you to sign up for MyChart if you have not already done so as this can be the easiest way for [place] to communicate results to you online or through a phone app.  Generally, we only contact you if it is a positive test result.  In the meantime, if you develop worsening  symptoms including fever, chest pain, shortness of breath despite our current treatment plan then please report to the emergency room as this may be a sign of worsening status from possible viral infection.  Otherwise, we will manage this as a viral syndrome. For sore throat or cough try using a honey-based tea. Use 3 teaspoons of honey with juice squeezed from half lemon. Place shaved pieces of ginger into 1/2-1 cup of water and warm over stove top. Then mix the ingredients and repeat every 4 hours as needed. Please take Tylenol 500mg -650mg  every 6 hours for aches and pains, fevers. Hydrate very well with at least 2 liters of water. Eat light meals such as soups to replenish electrolytes and soft fruits, veggies. Start an antihistamine like Zyrtec (10mg  daily) for postnasal drainage, sinus congestion.  You can take this together with pseudoephedrine (Sudafed) at a dose of 30 mg 2-3 times a day as needed for the same kind of congestion.  Use the cough medications as needed.

## 2023-03-11 NOTE — ED Triage Notes (Signed)
Pt c/o prod cough, nasal congestion, sore throat x 3 days-NAD-steady gait

## 2023-03-11 NOTE — ED Provider Notes (Signed)
[name]  Note:  This document was prepared using Conservation officer, historic buildings and may include unintentional dictation errors.  MRN: [id number] DOB: 1960-11-19  Subjective:   Cheryl White is a 62 y.o. female presenting for 3-day history of acute onset productive cough, sinus congestion, throat pain.  Has past medical history of lupus, Raynaud's, Sjogren's disease.  No smoking of any kind including cigarettes, cigars, vaping, marijuana use.  No formal diagnosis of respiratory disorders.  However, patient does report that she takes Advair and albuterol as needed especially when she gets sick.  She does have a PCP she can follow-up with.  Currently denies any active chest pain, shortness of breath or wheezing.  However, she would like a steroid injection as she usually does well with this.  No current facility-administered medications for this encounter.  Current Outpatient Medications:    albuterol (VENTOLIN HFA) 108 (90 Base) MCG/ACT inhaler, Inhale into the lungs., Disp: , Rfl:    alendronate (FOSAMAX) 70 MG tablet, Take 70 mg by mouth once a week., Disp: , Rfl:    ALPRAZolam (XANAX) 0.5 MG tablet, for sedation before MRI scan; take 1 tab 1 hour before scan; may repeat 1 tab 15 min before scan (Patient not taking: Reported on 02/06/2023), Disp: 3 tablet, Rfl: 0   benzonatate (TESSALON) 100 MG capsule, Take 100 mg by mouth as needed for cough. (Patient not taking: Reported on 03/09/2023), Disp: , Rfl:    cetirizine (ZYRTEC) 10 MG chewable tablet, Chew 10 mg by mouth daily. (Patient not taking: Reported on 02/06/2023), Disp: , Rfl:    ciclopirox (PENLAC) 8 % solution, Apply topically at bedtime. Apply over nail and surrounding skin. Apply daily over previous coat. After seven (7) days, may remove with alcohol and continue cycle., Disp: 6.6 mL, Rfl: 3   fluticasone-salmeterol (ADVAIR) 250-50 MCG/ACT AEPB, Inhale 1 puff into the lungs 2 (two) times daily., Disp: , Rfl:     gabapentin (NEURONTIN) 300 MG capsule, Take 1 capsule (300 mg total) by mouth at bedtime. (Patient not taking: Reported on 02/06/2023), Disp: 15 capsule, Rfl: 0   hydroxychloroquine (PLAQUENIL) 200 MG tablet, Take by mouth daily., Disp: , Rfl:    hydrOXYzine (ATARAX/VISTARIL) 10 MG tablet, Take 1 tablet (10 mg total) by mouth 3 (three) times daily as needed. (Patient not taking: Reported on 02/06/2023), Disp: 30 tablet, Rfl: 0   omeprazole (PRILOSEC) 40 MG capsule, Take 40 mg by mouth daily. (Patient not taking: Reported on 02/06/2023), Disp: , Rfl:    predniSONE (DELTASONE) 50 MG tablet, Take 1 tablet (50 mg total) by mouth daily with breakfast. (Patient not taking: Reported on 02/06/2023), Disp: 6 tablet, Rfl: 0   Allergies  Allergen Reactions   Lidocaine Shortness Of Breath    Allergy actually to Septocaine, not Lidocaine. Palpation and SOB   Other Anaphylaxis   Shrimp Extract Anaphylaxis   Iodinated Contrast Media Other (See Comments) and Rash   Shellfish Allergy     Past Medical History:  Diagnosis Date   Lupus (HCC)    Raynaud's disease    Sjogren's disease (HCC)      Past Surgical History:  Procedure Laterality Date   BREAST SURGERY     CESAREAN SECTION     TUBAL LIGATION      Family History  Problem Relation Age of Onset   Diabetes Mother    Diabetes Father    Arthritis/Rheumatoid Father    Diabetes Brother    Heart attack  Paternal Grandfather     Social History   Tobacco Use   Smoking status: Never   Smokeless tobacco: Never  Vaping Use   Vaping status: Never Used  Substance Use Topics   Alcohol use: No   Drug use: No    ROS   Objective:   Vitals: BP 104/67 (BP Location: Right Arm)   Pulse 63   Temp 98.1 F (36.7 C) (Oral)   Resp 20   SpO2 97%   Physical Exam Constitutional:      General: She is not in acute distress.    Appearance: Normal appearance. She is well-developed and normal weight. She is not ill-appearing, toxic-appearing or  diaphoretic.  HENT:     Head: Normocephalic and atraumatic.     Right Ear: Tympanic membrane, ear canal and external ear normal. No drainage or tenderness. No middle ear effusion. There is no impacted cerumen. Tympanic membrane is not erythematous or bulging.     Left Ear: Tympanic membrane, ear canal and external ear normal. No drainage or tenderness.  No middle ear effusion. There is no impacted cerumen. Tympanic membrane is not erythematous or bulging.     Nose: Nose normal. No congestion or rhinorrhea.     Mouth/Throat:     Mouth: Mucous membranes are moist. No oral lesions.     Pharynx: No pharyngeal swelling, oropharyngeal exudate, posterior oropharyngeal erythema or uvula swelling.     Tonsils: No tonsillar exudate or tonsillar abscesses.  Eyes:     General: No scleral icterus.       Right eye: No discharge.        Left eye: No discharge.     Extraocular Movements: Extraocular movements intact.     Right eye: Normal extraocular motion.     Left eye: Normal extraocular motion.     Conjunctiva/sclera: Conjunctivae normal.  Cardiovascular:     Rate and Rhythm: Normal rate and regular rhythm.     Heart sounds: Normal heart sounds. No murmur heard.    No friction rub. No gallop.  Pulmonary:     Effort: Pulmonary effort is normal. No respiratory distress.     Breath sounds: No stridor. No wheezing, rhonchi or rales.  Chest:     Chest wall: No tenderness.  Musculoskeletal:     Cervical back: Normal range of motion and neck supple.  Lymphadenopathy:     Cervical: No cervical adenopathy.  Skin:    General: Skin is warm and dry.  Neurological:     General: No focal deficit present.     Mental Status: She is alert and oriented to person, place, and time.  Psychiatric:        Mood and Affect: Mood normal.        Behavior: Behavior normal.     IM dexamethasone 10 mg administered in clinic.  Assessment and Plan :   PDMP not reviewed this encounter.  1. Acute viral syndrome    2. Mild persistent reactive airway disease without complication     IM steroids as above as patient historically does well with this and is appropriate in the context of what is likely reactive airway disease.  Deferred imaging given clear cardiopulmonary exam, hemodynamically stable vital signs. Will manage for viral illness such as viral URI, viral syndrome, viral rhinitis, COVID-19. Recommended supportive care. Offered scripts for symptomatic relief. Testing is pending. Counseled patient on potential for adverse effects with medications prescribed/recommended today, ER and return-to-clinic precautions discussed, patient verbalized understanding.   Patient  would be a good candidate for COVID antivirals if she test positive for COVID-19.  Specifically, would use Paxlovid.  No medication interactions.   Wallis Bamberg, [place] 03/11/23 [id number]

## 2023-03-12 LAB — SARS CORONAVIRUS 2 (TAT 6-24 HRS): SARS Coronavirus 2: NEGATIVE

## 2023-03-14 ENCOUNTER — Encounter: Payer: Self-pay | Admitting: Emergency Medicine

## 2023-03-14 DIAGNOSIS — G514 Facial myokymia: Secondary | ICD-10-CM

## 2023-03-16 ENCOUNTER — Ambulatory Visit: Payer: 59 | Attending: Diagnostic Neuroimaging

## 2023-03-16 DIAGNOSIS — M79643 Pain in unspecified hand: Secondary | ICD-10-CM | POA: Diagnosis not present

## 2023-03-16 DIAGNOSIS — R293 Abnormal posture: Secondary | ICD-10-CM | POA: Diagnosis not present

## 2023-03-16 DIAGNOSIS — M5416 Radiculopathy, lumbar region: Secondary | ICD-10-CM | POA: Diagnosis not present

## 2023-03-16 DIAGNOSIS — M6281 Muscle weakness (generalized): Secondary | ICD-10-CM | POA: Insufficient documentation

## 2023-03-16 DIAGNOSIS — M1990 Unspecified osteoarthritis, unspecified site: Secondary | ICD-10-CM | POA: Diagnosis not present

## 2023-03-16 DIAGNOSIS — I7300 Raynaud's syndrome without gangrene: Secondary | ICD-10-CM | POA: Diagnosis not present

## 2023-03-16 DIAGNOSIS — M329 Systemic lupus erythematosus, unspecified: Secondary | ICD-10-CM | POA: Diagnosis not present

## 2023-03-16 DIAGNOSIS — Z23 Encounter for immunization: Secondary | ICD-10-CM | POA: Diagnosis not present

## 2023-03-16 NOTE — Therapy (Signed)
OUTPATIENT PHYSICAL THERAPY NEURO EVALUATION   Patient Name: Cheryl White MRN: [id number] DOB:03-30-1961, 62 y.o., female Today's Date: 03/16/2023   PCP: Georgina Quint, MD REFERRING PROVIDER: Suanne Marker, MD   END OF SESSION:  PT End of Session - 03/16/23 1335     Visit Number 1    Number of Visits 9    Date for PT Re-Evaluation 04/13/23    Authorization Type [employer]    PT Start Time 1351    PT Stop Time 1437    PT Time Calculation (min) 46 min    Activity Tolerance Patient tolerated treatment well    Behavior During Therapy WFL for tasks assessed/performed             Past Medical History:  Diagnosis Date   Lupus (HCC)    Raynaud's disease    Sjogren's disease (HCC)    Past Surgical History:  Procedure Laterality Date   BREAST SURGERY     CESAREAN SECTION     TUBAL LIGATION     Patient Active Problem List   Diagnosis Date Noted   Suspected sleep apnea 02/06/2023   History of thyroid nodule 09/18/2022   History of osteoporosis 09/18/2022   Memory difficulty 11/02/2017   Panic attacks 07/18/2017   GERD (gastroesophageal reflux disease) 07/03/2017   Lupus (systemic lupus erythematosus) (HCC) 05/09/2017   Raynaud disease 05/09/2017   Generalized anxiety disorder 05/09/2017    ONSET DATE: 03/01/2023 referral  REFERRING DIAG: M54.16 (ICD-10-CM) - Right lumbar radiculopathy   THERAPY DIAG:  Abnormal posture - Plan: PT plan of care cert/re-cert  Muscle weakness (generalized) - Plan: PT plan of care cert/re-cert  Rationale for Evaluation and Treatment: Rehabilitation  SUBJECTIVE:                                                                                                                                                                                             SUBJECTIVE STATEMENT: Patient arrives to clinic alone. Interpretor, Alexander, present. She reports pain in her neck, low back and "joints." X-ray demonstrates "arthritis" per patient.  About a month ago she started experiencing "sensations" in her face. Imaging and blood work has been clear thus far, per patient.  Pt accompanied by: self  PERTINENT HISTORY: Lupus, Raynauds, Sjogren's   PAIN:  Are you having pain? Yes: NPRS scale: 8/10 Pain location: low back, neck and "joints" Pain description: "pressure all the way down the back like a bruise" Aggravating factors: sitting for too long, standing for too long, walking too far Relieving factors: reports nothing  PRECAUTIONS: Fall  RED FLAGS: Bowel or bladder incontinence: No  WEIGHT BEARING RESTRICTIONS: No  FALLS: Has patient fallen in last 6 months? No  LIVING ENVIRONMENT: Lives with: lives alone Lives in: House/apartment Stairs: No Has following equipment at home: None  PLOF: Independent  PATIENT GOALS: "to help with the pain"  OBJECTIVE:   DIAGNOSTIC FINDINGS: 01/27/23 c-spine xray:  IMPRESSION: Moderate degenerative disc disease in the mid and lower cervical spine with up to mild neuroforaminal stenosis.  MRI lumbar spine 02/05/23 IMPRESSION: This MRI of the lumbar spine without contrast shows the following: At L3-L4, degenerative changes cause borderline spinal stenosis and minimal lateral recess stenosis but no nerve root compression. At L4-L5, central disc protrusion and other degenerative changes cause is borderline spinal stenosis mild bilateral foraminal narrowing and moderate left greater than right lateral recess stenosis but no nerve root compression. At L5-S1, a small central disc extrusion and other degenerative changes cause moderate spinal stenosis, mild foraminal narrowing and moderate right greater than left lateral recess stenosis though there is no definite nerve root compression.  COGNITION: Overall cognitive status: Within functional limits for tasks assessed   SENSATION: Intermittent N/T in R LE   COORDINATION: WFL B  POSTURE: rounded shoulders, forward head, increased  thoracic kyphosis, posterior pelvic tilt, and flexed trunk   BED MOBILITY:  Reports independent   GAIT: Gait pattern: WFL Distance walked: clinic  PATIENT SURVEYS:  Modified Oswestry 46%   LUMBAR SPECIAL TESTS:  Straight leg raise test: Positive, Slump test: Negative, Single leg stance test: Positive, FABER test: Negative, and Trendelenburg sign: Positive   CERVICAL SPECIAL TESTS:  Upper limb tension test (ULTT): Positive, Spurling's test: Positive, Distraction test: Positive, and Sharp pursor's test: Negative  R: (+) ulnar nn L: (+) ulnar, radial, median nn   TODAY'S TREATMENT:                                                                                                                              N/a eval   PATIENT EDUCATION: Education details: PT POC, exam findings Person educated: Patient Education method: Medical illustrator Education comprehension: verbalized understanding and needs further education  HOME EXERCISE PROGRAM: To be provided  GOALS: Goals reviewed with patient? Yes  SHORT TERM GOALS: =LTG based on PT POC length  LONG TERM GOALS: Target date: 04/13/23  Pt will be independent with final HEP for improved symptom report  Baseline: to be provided  Goal status: INITIAL  2.  Patient will score </= 40% on oswestry to indicate reduction in symptoms Baseline: 46% Goal status: INITIAL   ASSESSMENT:  CLINICAL IMPRESSION: Patient is a 62 y.o. female who was seen today for physical therapy evaluation and treatment for low back and cervical pain. Reports complicated by PMH of Lupus, Raynouds and Sjogren's.  She does have (+) upper limb tension tests, likely compounded by poor posture. She would benefit from skilled PT services to address the above mentioned deficits.   OBJECTIVE IMPAIRMENTS: decreased knowledge of condition, decreased mobility, increased  fascial restrictions, increased muscle spasms, impaired sensation, postural dysfunction, and  pain.   ACTIVITY LIMITATIONS: carrying, lifting, sleeping, bed mobility, reach over head, hygiene/grooming, locomotion level, and caring for others  PARTICIPATION LIMITATIONS: cleaning, interpersonal relationship, driving, shopping, community activity, and occupation  PERSONAL FACTORS: Age, Past/current experiences, Time since onset of injury/illness/exacerbation, and 3+ comorbidities: see above  are also affecting patient's functional outcome.   REHAB POTENTIAL: Fair time since onset  CLINICAL DECISION MAKING: Stable/uncomplicated  EVALUATION COMPLEXITY: Low  PLAN:  PT FREQUENCY: 2x/week  PT DURATION: 4 weeks  PLANNED INTERVENTIONS: Therapeutic exercises, Therapeutic activity, Neuromuscular re-education, Balance training, Gait training, Patient/Family education, Self Care, Joint mobilization, Stair training, Vestibular training, Visual/preceptual remediation/compensation, DME instructions, Aquatic Therapy, Dry Needling, Electrical stimulation, Spinal mobilization, Cryotherapy, Moist heat, Taping, Traction, Manual therapy, and Re-evaluation  PLAN FOR NEXT SESSION: HEP   Westley Foots, PT Westley Foots, PT, DPT, CBIS  03/16/2023, 2:45 PM

## 2023-03-19 ENCOUNTER — Ambulatory Visit: Payer: 59 | Admitting: Emergency Medicine

## 2023-03-19 ENCOUNTER — Ambulatory Visit: Payer: 59 | Admitting: Physical Therapy

## 2023-03-19 DIAGNOSIS — M5416 Radiculopathy, lumbar region: Secondary | ICD-10-CM | POA: Diagnosis not present

## 2023-03-19 DIAGNOSIS — M6281 Muscle weakness (generalized): Secondary | ICD-10-CM | POA: Diagnosis not present

## 2023-03-19 DIAGNOSIS — R293 Abnormal posture: Secondary | ICD-10-CM

## 2023-03-19 NOTE — Therapy (Signed)
OUTPATIENT PHYSICAL THERAPY NEURO TREATMENT   Patient Name: Cheryl White MRN: [id number] DOB:07-24-1960, 62 y.o., female Today's Date: 03/19/2023   PCP: Georgina Quint, MD REFERRING PROVIDER: Suanne Marker, MD   END OF SESSION:  PT End of Session - 03/19/23 0848     Visit Number 2    Number of Visits 9    Date for PT Re-Evaluation 04/13/23    Authorization Type U[employer]    PT Start Time 0845    PT Stop Time 0930    PT Time Calculation (min) 45 min    Activity Tolerance Patient tolerated treatment well    Behavior During Therapy [facility] for tasks assessed/performed              Past Medical History:  Diagnosis Date   Lupus (HCC)    Raynaud's disease    Sjogren's disease (HCC)    Past Surgical History:  Procedure Laterality Date   BREAST SURGERY     CESAREAN SECTION     TUBAL LIGATION     Patient Active Problem List   Diagnosis Date Noted   Suspected sleep apnea 02/06/2023   History of thyroid nodule 09/18/2022   History of osteoporosis 09/18/2022   Memory difficulty 11/02/2017   Panic attacks 07/18/2017   GERD (gastroesophageal reflux disease) 07/03/2017   Lupus (systemic lupus erythematosus) (HCC) 05/09/2017   Raynaud disease 05/09/2017   Generalized anxiety disorder 05/09/2017    ONSET DATE: 03/01/2023 referral  REFERRING DIAG: M54.16 (ICD-10-CM) - Right lumbar radiculopathy   THERAPY DIAG:  Abnormal posture  Muscle weakness (generalized)  Rationale for Evaluation and Treatment: Rehabilitation  SUBJECTIVE:                                                                                                                                                                                             SUBJECTIVE STATEMENT: Pt reports that driving is increasing her pain, she is having 9/10 pain in her neck and into her shoulders this morning. Pt also reports having pain in her low back, down her RLE, and in her upper thighs on the outside. Pt will have  flare-ups of her pain that will last about 40 min, this happened last Friday and on Sunday. Pt's R shoulder noted to be rotated anteriorly.  Pt accompanied by: self, interpreter Merlene Laughter  PERTINENT HISTORY: Lupus, Raynauds, Sjogren's   PAIN:  Are you having pain? Yes: NPRS scale: 9/10 Pain location: low back, neck and "joints" Pain description: "pressure all the way down the back like a bruise" Aggravating factors: sitting for too long, standing for too long, walking too far Relieving  factors: reports nothing  PRECAUTIONS: Fall  RED FLAGS: Bowel or bladder incontinence: No   WEIGHT BEARING RESTRICTIONS: No  FALLS: Has patient fallen in last 6 months? No  LIVING ENVIRONMENT: Lives with: lives alone Lives in: House/apartment Stairs: No Has following equipment at home: None  PLOF: Independent  PATIENT GOALS: "to help with the pain"  OBJECTIVE:   DIAGNOSTIC FINDINGS: 01/27/23 c-spine xray:  IMPRESSION: Moderate degenerative disc disease in the mid and lower cervical spine with up to mild neuroforaminal stenosis.  MRI lumbar spine 02/05/23 IMPRESSION: This MRI of the lumbar spine without contrast shows the following: At L3-L4, degenerative changes cause borderline spinal stenosis and minimal lateral recess stenosis but no nerve root compression. At L4-L5, central disc protrusion and other degenerative changes cause is borderline spinal stenosis mild bilateral foraminal narrowing and moderate left greater than right lateral recess stenosis but no nerve root compression. At L5-S1, a small central disc extrusion and other degenerative changes cause moderate spinal stenosis, mild foraminal narrowing and moderate right greater than left lateral recess stenosis though there is no definite nerve root compression.  COGNITION: Overall cognitive status: Within functional limits for tasks assessed   SENSATION: Intermittent N/T in R LE   COORDINATION: WFL B  POSTURE: rounded  shoulders, forward head, increased thoracic kyphosis, posterior pelvic tilt, and flexed trunk   BED MOBILITY:  Reports independent   GAIT: Gait pattern: WFL Distance walked: clinic  PATIENT SURVEYS:  Modified Oswestry 46%   LUMBAR SPECIAL TESTS:  Straight leg raise test: Positive, Slump test: Negative, Single leg stance test: Positive, FABER test: Negative, and Trendelenburg sign: Positive   CERVICAL SPECIAL TESTS:  Upper limb tension test (ULTT): Positive, Spurling's test: Positive, Distraction test: Positive, and Sharp pursor's test: Negative  R: (+) ulnar nn L: (+) ulnar, radial, median nn   TODAY'S TREATMENT:                                                                                                                               TherEx Session focus on assessing R shoulder ROM and trialing various exercises to determine what would be appropriate to assign patient for an HEP: Supine R shoulder PROM Pain with ER, flexion, and abduction on top of shoulder but ROM WFL  Supine thoracic mobilization stretch x 10 reps over towel roll Supine open book stretch over towel roll x 10 reps  Supine sciatic nerve flossing x 5 reps B (feels more on R than L) Supine piriformis stretch 3 x 30 sec each B  Seated scap squeezes x 10 reps with cues for correct body mechanics  Added appropriate exercises to HEP, see bolded below   PATIENT EDUCATION: Education details: discussed cause of nerve pain/symptoms, initiated HEP Person educated: Patient Education method: Medical illustrator Education comprehension: verbalized understanding, returned demonstration, and needs further education  HOME EXERCISE PROGRAM: Access Code: [email or web address] URL: [email or web address] Date: 03/19/2023 Prepared by: Peter Congo  Exercises - Open  Book Chest Stretch on Towel Roll  - 1 x daily - 7 x weekly - 3 sets - 10 reps - Supine 90/90 Sciatic Nerve Glide with Knee Flexion/Extension   - 1 x daily - 7 x weekly - 1 sets - 5 reps - Supine Figure 4 Piriformis Stretch  - 1 x daily - 7 x weekly - 1 sets - 3-5 reps - 30 sec hold - Seated Scapular Retraction  - 1 x daily - 7 x weekly - 3 sets - 10 reps  GOALS: Goals reviewed with patient? Yes  SHORT TERM GOALS: =LTG based on PT POC length  LONG TERM GOALS: Target date: 04/13/23  Pt will be independent with final HEP for improved symptom report  Baseline: to be provided  Goal status: INITIAL  2.  Patient will score </= 40% on oswestry to indicate reduction in symptoms Baseline: 46% Goal status: INITIAL   ASSESSMENT:  CLINICAL IMPRESSION: Emphasis of skilled PT session on assessing R shoulder ROM and trialing various exercises in order to initiate HEP to address pain and dysfunction. Pt noted to have pain in anterior and top of her R shoulder with ROM though her ROM is [facility]. Pt can benefit from continued postural retraining as well as stretching of her R pectoral muscles in order to improve her R shoulder mechanics and decrease her pain. Additionally, pt with increased sensitivity in her R sciatic nerve as compared to her L, continues to benefit from skilled therapy services to address this and increase her independence with management of her radicular symptoms. Continue POC.   OBJECTIVE IMPAIRMENTS: decreased knowledge of condition, decreased mobility, increased fascial restrictions, increased muscle spasms, impaired sensation, postural dysfunction, and pain.   ACTIVITY LIMITATIONS: carrying, lifting, sleeping, bed mobility, reach over head, hygiene/grooming, locomotion level, and caring for others  PARTICIPATION LIMITATIONS: cleaning, interpersonal relationship, driving, shopping, community activity, and occupation  PERSONAL FACTORS: Age, Past/current experiences, Time since onset of injury/illness/exacerbation, and 3+ comorbidities: see above  are also affecting patient's functional outcome.   REHAB POTENTIAL: Fair time  since onset  CLINICAL DECISION MAKING: Stable/uncomplicated  EVALUATION COMPLEXITY: Low  PLAN:  PT FREQUENCY: 2x/week  PT DURATION: 4 weeks  PLANNED INTERVENTIONS: Therapeutic exercises, Therapeutic activity, Neuromuscular re-education, Balance training, Gait training, Patient/Family education, Self Care, Joint mobilization, Stair training, Vestibular training, Visual/preceptual remediation/compensation, DME instructions, Aquatic Therapy, Dry Needling, Electrical stimulation, Spinal mobilization, Cryotherapy, Moist heat, Taping, Traction, Manual therapy, and Re-evaluation  PLAN FOR NEXT SESSION: how is initial HEP? Add to HEP as appropriate (address cervical ROM and ULNT), core strengthening/stability    Peter Congo, PT, DPT, [facility] [address] [place], [place]  [address] Phone:  [phone] Fax:  [phone]   03/19/2023, 9:31 AM

## 2023-03-22 ENCOUNTER — Ambulatory Visit: Payer: 59 | Admitting: Physical Therapy

## 2023-03-22 DIAGNOSIS — M5416 Radiculopathy, lumbar region: Secondary | ICD-10-CM | POA: Diagnosis not present

## 2023-03-22 DIAGNOSIS — R293 Abnormal posture: Secondary | ICD-10-CM

## 2023-03-22 DIAGNOSIS — M6281 Muscle weakness (generalized): Secondary | ICD-10-CM | POA: Diagnosis not present

## 2023-03-22 NOTE — Therapy (Signed)
OUTPATIENT PHYSICAL THERAPY NEURO TREATMENT   Patient Name: Cheryl Alderfer MRN: [id number] DOB:Nov 11, 1960, 62 y.o., female Today's Date: 03/22/2023   PCP: Georgina Quint, MD REFERRING PROVIDER: Suanne Marker, MD   END OF SESSION:  PT End of Session - 03/22/23 1401     Visit Number 3    Number of Visits 9    Date for PT Re-Evaluation 04/13/23    Authorization Type [employer]    PT Start Time 1400    PT Stop Time 1440    PT Time Calculation (min) 40 min    Activity Tolerance Patient tolerated treatment well    Behavior During Therapy WFL for tasks assessed/performed               Past Medical History:  Diagnosis Date   Lupus (HCC)    Raynaud's disease    Sjogren's disease (HCC)    Past Surgical History:  Procedure Laterality Date   BREAST SURGERY     CESAREAN SECTION     TUBAL LIGATION     Patient Active Problem List   Diagnosis Date Noted   Suspected sleep apnea 02/06/2023   History of thyroid nodule 09/18/2022   History of osteoporosis 09/18/2022   Memory difficulty 11/02/2017   Panic attacks 07/18/2017   GERD (gastroesophageal reflux disease) 07/03/2017   Lupus (systemic lupus erythematosus) (HCC) 05/09/2017   Raynaud disease 05/09/2017   Generalized anxiety disorder 05/09/2017    ONSET DATE: 03/01/2023 referral  REFERRING DIAG: M54.16 (ICD-10-CM) - Right lumbar radiculopathy   THERAPY DIAG:  Abnormal posture  Rationale for Evaluation and Treatment: Rehabilitation  SUBJECTIVE:                                                                                                                                                                                             SUBJECTIVE STATEMENT: Pt reports no big changes since last visit, little bit less pain than previously. Pt reports that after last session she had difficulty driving home due to pain, found out she can get a bus ride through her insurance so took bus for transportation today.  Pt felt  like exercises performed last visit were helpful for managing pain, has been doing her HEP.   Pt accompanied by: self, interpreter Osvaldo Human  PERTINENT HISTORY: Lupus, Raynauds, Sjogren's   PAIN:  Are you having pain? Yes: NPRS scale: 9/10 Pain location: low back, neck and "joints" Pain description: "pressure all the way down the back like a bruise" Aggravating factors: sitting for too long, standing for too long, walking too far Relieving factors: reports nothing  PRECAUTIONS: Fall  RED FLAGS:  Bowel or bladder incontinence: No   WEIGHT BEARING RESTRICTIONS: No  FALLS: Has patient fallen in last 6 months? No  LIVING ENVIRONMENT: Lives with: lives alone Lives in: House/apartment Stairs: No Has following equipment at home: None  PLOF: Independent  PATIENT GOALS: "to help with the pain"  OBJECTIVE:   DIAGNOSTIC FINDINGS: 01/27/23 c-spine xray:  IMPRESSION: Moderate degenerative disc disease in the mid and lower cervical spine with up to mild neuroforaminal stenosis.  MRI lumbar spine 02/05/23 IMPRESSION: This MRI of the lumbar spine without contrast shows the following: At L3-L4, degenerative changes cause borderline spinal stenosis and minimal lateral recess stenosis but no nerve root compression. At L4-L5, central disc protrusion and other degenerative changes cause is borderline spinal stenosis mild bilateral foraminal narrowing and moderate left greater than right lateral recess stenosis but no nerve root compression. At L5-S1, a small central disc extrusion and other degenerative changes cause moderate spinal stenosis, mild foraminal narrowing and moderate right greater than left lateral recess stenosis though there is no definite nerve root compression.  COGNITION: Overall cognitive status: Within functional limits for tasks assessed   SENSATION: Intermittent N/T in R LE   COORDINATION: WFL B  POSTURE: rounded shoulders, forward head, increased thoracic kyphosis,  posterior pelvic tilt, and flexed trunk   BED MOBILITY:  Reports independent   GAIT: Gait pattern: WFL Distance walked: clinic  PATIENT SURVEYS:  Modified Oswestry 46%   LUMBAR SPECIAL TESTS:  Straight leg raise test: Positive, Slump test: Negative, Single leg stance test: Positive, FABER test: Negative, and Trendelenburg sign: Positive   CERVICAL SPECIAL TESTS:  Upper limb tension test (ULTT): Positive, Spurling's test: Positive, Distraction test: Positive, and Sharp pursor's test: Negative  R: (+) ulnar nn L: (+) ulnar, radial, median nn   TODAY'S TREATMENT:                                                                                                                               TherEx Supine on mat table to work on core strengthening and stability: LTR 2 x 10 reps B Supine TA contract x 10 reps +alt L/R marches 2 x 10 reps B +bridges 2 x 10 reps SKTC 3 x 30 sec each B, x 2 sets  Standing doorway R pec stretch 3 x 30 sec each  Seated IYT's: Shoulder flexion x 10 reps B Shoulder scaption x 10 reps B Shoulder abduction x 10 reps B  Added to HEP, see bolded below    PATIENT EDUCATION: Education details: continue HEP and added to HEP Person educated: Patient Education method: Medical illustrator Education comprehension: verbalized understanding, returned demonstration, and needs further education  HOME EXERCISE PROGRAM: Access Code: [email or web address] URL: [email or web address] Date: 03/19/2023 Prepared by: Peter Congo  Exercises - Open Book Chest Stretch on Towel Roll  - 1 x daily - 7 x weekly - 3 sets - 10 reps - Supine 90/90 Sciatic Nerve Glide with Knee Flexion/Extension  -  1 x daily - 7 x weekly - 1 sets - 5 reps - Supine Figure 4 Piriformis Stretch  - 1 x daily - 7 x weekly - 1 sets - 3-5 reps - 30 sec hold - Seated Scapular Retraction  - 1 x daily - 7 x weekly - 3 sets - 10 reps - Supine March with Posterior Pelvic Tilt  - 1 x daily -  7 x weekly - 3 sets - 10 reps - Supine Bridge  - 1 x daily - 7 x weekly - 3 sets - 10 reps - 5 sec hold - Seated Shoulder Flexion Full Range  - 1 x daily - 7 x weekly - 3 sets - 10 reps - Seated Shoulder Scaption  - 1 x daily - 7 x weekly - 3 sets - 10 reps - Seated Shoulder Abduction  - 1 x daily - 7 x weekly - 3 sets - 10 reps  GOALS: Goals reviewed with patient? Yes  SHORT TERM GOALS: =LTG based on PT POC length  LONG TERM GOALS: Target date: 04/13/23  Pt will be independent with final HEP for improved symptom report  Baseline: to be provided  Goal status: INITIAL  2.  Patient will score </= 40% on oswestry to indicate reduction in symptoms Baseline: 46% Goal status: INITIAL   ASSESSMENT:  CLINICAL IMPRESSION: Emphasis of skilled PT session on continuing to work on core strengthening and UB/shoulder strengthening in order to decrease pain and increase independence with management of pain symptoms. Pt with good response to all exercises performed this session, added to HEP. Encouraged pt to choose at least 3 exercises to work on every day as she now has an extensive HEP. Pt continues to benefit from skilled therapy services to work on decreasing her pain and improving her function. Continue POC.   OBJECTIVE IMPAIRMENTS: decreased knowledge of condition, decreased mobility, increased fascial restrictions, increased muscle spasms, impaired sensation, postural dysfunction, and pain.   ACTIVITY LIMITATIONS: carrying, lifting, sleeping, bed mobility, reach over head, hygiene/grooming, locomotion level, and caring for others  PARTICIPATION LIMITATIONS: cleaning, interpersonal relationship, driving, shopping, community activity, and occupation  PERSONAL FACTORS: Age, Past/current experiences, Time since onset of injury/illness/exacerbation, and 3+ comorbidities: see above  are also affecting patient's functional outcome.   REHAB POTENTIAL: Fair time since onset  CLINICAL DECISION  MAKING: Stable/uncomplicated  EVALUATION COMPLEXITY: Low  PLAN:  PT FREQUENCY: 2x/week  PT DURATION: 4 weeks  PLANNED INTERVENTIONS: Therapeutic exercises, Therapeutic activity, Neuromuscular re-education, Balance training, Gait training, Patient/Family education, Self Care, Joint mobilization, Stair training, Vestibular training, Visual/preceptual remediation/compensation, DME instructions, Aquatic Therapy, Dry Needling, Electrical stimulation, Spinal mobilization, Cryotherapy, Moist heat, Taping, Traction, Manual therapy, and Re-evaluation  PLAN FOR NEXT SESSION: how is HEP? Add to HEP as appropriate (address cervical ROM and ULNT), core strengthening/stability, progress to prone IYT for periscapular strengthening?    Peter Congo, PT, DPT, [facility] [address] [place], [place]  [address] Phone:  [phone] Fax:  [phone]   03/22/2023, 2:41 PM

## 2023-03-26 ENCOUNTER — Telehealth: Payer: Self-pay | Admitting: Emergency Medicine

## 2023-03-26 NOTE — Telephone Encounter (Signed)
Cheryl White is assisting the patient with her citizenship. She may be able to get a waiver for the test. Cheryl White would like to help the patient get scheduled for an appointment with PCP for this paperwork, but he wanted to speak with the nurse or Dr. Alvy Bimler before it gets scheduled. He provided a phone number and email: Phone: [phone] Email: [email or web address]

## 2023-03-27 NOTE — Telephone Encounter (Signed)
Called patient's daughter in reference to previous message. Informed her that we are not able to discuss any medical information with the law office due to HIPAA laws. Patient daughter will reach out to the law office and call the office back to schedule an appointment is needed.

## 2023-03-28 ENCOUNTER — Ambulatory Visit: Payer: 59

## 2023-03-28 DIAGNOSIS — M6281 Muscle weakness (generalized): Secondary | ICD-10-CM

## 2023-03-28 DIAGNOSIS — M5416 Radiculopathy, lumbar region: Secondary | ICD-10-CM | POA: Diagnosis not present

## 2023-03-28 DIAGNOSIS — R293 Abnormal posture: Secondary | ICD-10-CM | POA: Diagnosis not present

## 2023-03-28 NOTE — Therapy (Signed)
OUTPATIENT PHYSICAL THERAPY NEURO TREATMENT   Patient Name: Cheryl White MRN: [id number] DOB:25-Dec-1960, 62 y.o., female Today's Date: 03/28/2023   PCP: Georgina Quint, MD REFERRING PROVIDER: Suanne Marker, MD   END OF SESSION:  PT End of Session - 03/28/23 0849     Visit Number 4    Number of Visits 9    Date for PT Re-Evaluation 04/13/23    Authorization Type UHC    PT Start Time 0846    PT Stop Time 0927    PT Time Calculation (min) 41 min    Activity Tolerance Patient tolerated treatment well    Behavior During Therapy [facility] for tasks assessed/performed               Past Medical History:  Diagnosis Date   Lupus (HCC)    Raynaud's disease    Sjogren's disease (HCC)    Past Surgical History:  Procedure Laterality Date   BREAST SURGERY     CESAREAN SECTION     TUBAL LIGATION     Patient Active Problem List   Diagnosis Date Noted   Suspected sleep apnea 02/06/2023   History of thyroid nodule 09/18/2022   History of osteoporosis 09/18/2022   Memory difficulty 11/02/2017   Panic attacks 07/18/2017   GERD (gastroesophageal reflux disease) 07/03/2017   Lupus (systemic lupus erythematosus) (HCC) 05/09/2017   Raynaud disease 05/09/2017   Generalized anxiety disorder 05/09/2017    ONSET DATE: 03/01/2023 referral  REFERRING DIAG: M54.16 (ICD-10-CM) - Right lumbar radiculopathy   THERAPY DIAG:  Abnormal posture  Muscle weakness (generalized)  Rationale for Evaluation and Treatment: Rehabilitation  SUBJECTIVE:                                                                                                                                                                                             SUBJECTIVE STATEMENT: Patient reports doing well. Did have a sudden sharp pain in the center of neck since last appt, but has since started to resolve. Denies falls.    Pt accompanied by: self, interpreter Milly  PERTINENT HISTORY: Lupus, Raynauds,  Sjogren's   PAIN:  Are you having pain? 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  MRI lumbar spine 02/05/23 IMPRESSION: This MRI of the lumbar spine without contrast shows the following: At L3-L4, degenerative changes cause borderline spinal stenosis and minimal lateral recess stenosis but no nerve root compression. At L4-L5, central disc protrusion and other degenerative changes cause is borderline spinal stenosis mild bilateral foraminal narrowing and moderate left greater than right lateral recess stenosis but no nerve root compression. At L5-S1, a small central disc extrusion and other degenerative changes cause moderate spinal stenosis, mild foraminal narrowing and moderate right greater than left lateral recess stenosis though there is no definite nerve root compression.  CERVICAL SPECIAL TESTS:  Upper limb tension test (ULTT): Positive, Spurling's test: Positive, Distraction test: Positive, and Sharp pursor's test: Negative  R: (+) ulnar nn L: (+) ulnar, radial, median nn   TODAY'S TREATMENT:                                                                                                                               TherEx -supine cervical retraction 2x10  -seated shoulder ER 2x12 red theraband  -seated UE D2 red theraband   Manual: -gentle c-spine mobs level C2/C3 -lateral stretch  -manual facilitation into cervical retraction -over pressure stretch to levator scap, upper trap    PATIENT EDUCATION: Education details:  continue HEP Person educated: Patient Education method: Medical illustrator Education comprehension: verbalized understanding, returned demonstration, and needs further education  HOME EXERCISE PROGRAM: Access Code: [email or web address] URL: [email or web address] Date: 03/19/2023 Prepared by: Peter Congo  Exercises - Open Book Chest Stretch on Towel Roll  - 1 x daily - 7 x weekly - 3 sets - 10 reps - Supine 90/90 Sciatic Nerve Glide with Knee Flexion/Extension  - 1 x daily - 7 x weekly - 1 sets - 5 reps - Supine Figure 4 Piriformis Stretch  - 1 x daily - 7 x weekly - 1 sets - 3-5 reps - 30 sec hold - Seated Scapular Retraction  - 1 x daily - 7 x weekly - 3 sets - 10 reps - Supine March with Posterior Pelvic Tilt  - 1 x daily - 7 x weekly - 3 sets - 10 reps - Supine Bridge  - 1 x daily - 7 x weekly - 3 sets - 10 reps - 5 sec hold - Seated Shoulder Flexion Full Range  - 1 x daily - 7 x weekly - 3 sets - 10 reps - Seated Shoulder Scaption  - 1 x daily - 7 x weekly - 3 sets - 10 reps - Seated Shoulder Abduction  - 1 x daily - 7 x weekly - 3 sets - 10 reps  GOALS: Goals reviewed with patient? Yes  SHORT TERM GOALS: =LTG based on PT POC length  LONG TERM GOALS: Target date: 04/13/23  Pt will be independent with final HEP for improved symptom report  Baseline: to be provided  Goal status: INITIAL  2.  Patient will score </=  40% on oswestry to indicate reduction in symptoms Baseline: 46% Goal status: INITIAL   ASSESSMENT:  CLINICAL IMPRESSION: Patient seen for skilled PT session with emphasis on c-spine mobilization and exercises. Tolerated manual therapy well with general reduction in symptoms. Symptoms were not radicular in nature today. Required hand over hand assist for proper exercise form to minimize compensatory upper trap engagement. Continue POC.    OBJECTIVE IMPAIRMENTS: decreased knowledge of condition, decreased mobility, increased fascial restrictions,  increased muscle spasms, impaired sensation, postural dysfunction, and pain.   ACTIVITY LIMITATIONS: carrying, lifting, sleeping, bed mobility, reach over head, hygiene/grooming, locomotion level, and caring for others  PARTICIPATION LIMITATIONS: cleaning, interpersonal relationship, driving, shopping, community activity, and occupation  PERSONAL FACTORS: Age, Past/current experiences, Time since onset of injury/illness/exacerbation, and 3+ comorbidities: see above  are also affecting patient's functional outcome.   REHAB POTENTIAL: Fair time since onset  CLINICAL DECISION MAKING: Stable/uncomplicated  EVALUATION COMPLEXITY: Low  PLAN:  PT FREQUENCY: 2x/week  PT DURATION: 4 weeks  PLANNED INTERVENTIONS: Therapeutic exercises, Therapeutic activity, Neuromuscular re-education, Balance training, Gait training, Patient/Family education, Self Care, Joint mobilization, Stair training, Vestibular training, Visual/preceptual remediation/compensation, DME instructions, Aquatic Therapy, Dry Needling, Electrical stimulation, Spinal mobilization, Cryotherapy, Moist heat, Taping, Traction, Manual therapy, and Re-evaluation  PLAN FOR NEXT SESSION: how is HEP? Add to HEP as appropriate (address cervical ROM and ULNT), core strengthening/stability, progress to prone IYT for periscapular strengthening?    Westley Foots, PT, DPT, CBIS    03/28/2023, 9:36 AM

## 2023-03-30 ENCOUNTER — Ambulatory Visit: Payer: 59

## 2023-03-30 DIAGNOSIS — R293 Abnormal posture: Secondary | ICD-10-CM | POA: Diagnosis not present

## 2023-03-30 DIAGNOSIS — M6281 Muscle weakness (generalized): Secondary | ICD-10-CM

## 2023-03-30 DIAGNOSIS — M5416 Radiculopathy, lumbar region: Secondary | ICD-10-CM | POA: Diagnosis not present

## 2023-03-30 NOTE — Therapy (Signed)
OUTPATIENT PHYSICAL THERAPY NEURO TREATMENT   Patient Name: Cheryl White MRN: [id number] DOB:1961/06/05, 62 y.o., female Today's Date: 03/30/2023   PCP: Georgina Quint, MD REFERRING PROVIDER: Suanne Marker, MD   END OF SESSION:  PT End of Session - 03/30/23 0759     Visit Number 5    Number of Visits 9    Date for PT Re-Evaluation 04/13/23    Authorization Type UHC    PT Start Time 0846    PT Stop Time 0930    PT Time Calculation (min) 44 min    Activity Tolerance Patient tolerated treatment well    Behavior During Therapy [facility] for tasks assessed/performed               Past Medical History:  Diagnosis Date   Lupus (HCC)    Raynaud's disease    Sjogren's disease (HCC)    Past Surgical History:  Procedure Laterality Date   BREAST SURGERY     CESAREAN SECTION     TUBAL LIGATION     Patient Active Problem List   Diagnosis Date Noted   Suspected sleep apnea 02/06/2023   History of thyroid nodule 09/18/2022   History of osteoporosis 09/18/2022   Memory difficulty 11/02/2017   Panic attacks 07/18/2017   GERD (gastroesophageal reflux disease) 07/03/2017   Lupus (systemic lupus erythematosus) (HCC) 05/09/2017   Raynaud disease 05/09/2017   Generalized anxiety disorder 05/09/2017    ONSET DATE: 03/01/2023 referral  REFERRING DIAG: M54.16 (ICD-10-CM) - Right lumbar radiculopathy   THERAPY DIAG:  Abnormal posture  Muscle weakness (generalized)  Rationale for Evaluation and Treatment: Rehabilitation  SUBJECTIVE:                                                                                                                                                                                             SUBJECTIVE STATEMENT: Patient reports doing well. Does report some low back pain when changing her grandson's diaper (demonstrates bending + twisting). Denies falls.    Pt accompanied by: self, interpreter   PERTINENT HISTORY: Lupus, Raynauds,  Sjogren's   PAIN:  Are you having pain? Yes: NPRS scale: 7/10 Pain location: low back, neck and "joints" Pain description: "pressure all the way down the back like a bruise" Aggravating factors: sitting for too long, standing for too long, walking too far Relieving factors: reports nothing  PRECAUTIONS: Fall   PATIENT GOALS: "to help with the pain"  OBJECTIVE:   DIAGNOSTIC FINDINGS: 01/27/23 c-spine xray:  IMPRESSION: Moderate degenerative disc disease in the mid and lower cervical spine with up to mild neuroforaminal stenosis.  MRI lumbar  spine 02/05/23 IMPRESSION: This MRI of the lumbar spine without contrast shows the following: At L3-L4, degenerative changes cause borderline spinal stenosis and minimal lateral recess stenosis but no nerve root compression. At L4-L5, central disc protrusion and other degenerative changes cause is borderline spinal stenosis mild bilateral foraminal narrowing and moderate left greater than right lateral recess stenosis but no nerve root compression. At L5-S1, a small central disc extrusion and other degenerative changes cause moderate spinal stenosis, mild foraminal narrowing and moderate right greater than left lateral recess stenosis though there is no definite nerve root compression.  TODAY'S TREATMENT:                                                                                                                               TherEx -hooklying bridges 2x10 -LTR (reported increase in R SI pain) -prone press up with overpressure to R SIJ -birddog 2x8  -sit <> stand x6 5# kettle bell -> minisquat 5# kettle bell   Manual: -gentle STM/release to R glute med/Glute max using tennis ball    PATIENT EDUCATION: Education details: continue HEP Person educated: Patient Education method: Medical illustrator Education comprehension: verbalized understanding, returned demonstration, and needs further education  HOME EXERCISE  PROGRAM: Access Code: [email or web address] URL: [email or web address] Date: 03/19/2023 Prepared by: Peter Congo  Exercises - Open Book Chest Stretch on Towel Roll  - 1 x daily - 7 x weekly - 3 sets - 10 reps - Supine 90/90 Sciatic Nerve Glide with Knee Flexion/Extension  - 1 x daily - 7 x weekly - 1 sets - 5 reps - Supine Figure 4 Piriformis Stretch  - 1 x daily - 7 x weekly - 1 sets - 3-5 reps - 30 sec hold - Seated Scapular Retraction  - 1 x daily - 7 x weekly - 3 sets - 10 reps - Supine March with Posterior Pelvic Tilt  - 1 x daily - 7 x weekly - 3 sets - 10 reps - Supine Bridge  - 1 x daily - 7 x weekly - 3 sets - 10 reps - 5 sec hold - Seated Shoulder Flexion Full Range  - 1 x daily - 7 x weekly - 3 sets - 10 reps - Seated Shoulder Scaption  - 1 x daily - 7 x weekly - 3 sets - 10 reps - Seated Shoulder Abduction  - 1 x daily - 7 x weekly - 3 sets - 10 reps  GOALS: Goals reviewed with patient? Yes  SHORT TERM GOALS: =LTG based on PT POC length  LONG TERM GOALS: Target date: 04/13/23  Pt will be independent with final HEP for improved symptom report  Baseline: to be provided  Goal status: INITIAL  2.  Patient will score </= 40% on oswestry to indicate reduction in symptoms Baseline: 46% Goal status: INITIAL   ASSESSMENT:  CLINICAL IMPRESSION: Patient seen for skilled PT session with emphasis on lumbar therex and posture re-training. Ended  session with reduction in pain (down to 4/10) and improved body mechanics. Exercises targeting core strength to support low back. Reports pin point pain in superior lateral R glute max/med, but PT unable to palpate trigger point. Good response to gentle STM release however. Continue POC.    OBJECTIVE IMPAIRMENTS: decreased knowledge of condition, decreased mobility, increased fascial restrictions, increased muscle spasms, impaired sensation, postural dysfunction, and pain.   ACTIVITY LIMITATIONS: carrying, lifting, sleeping, bed  mobility, reach over head, hygiene/grooming, locomotion level, and caring for others  PARTICIPATION LIMITATIONS: cleaning, interpersonal relationship, driving, shopping, community activity, and occupation  PERSONAL FACTORS: Age, Past/current experiences, Time since onset of injury/illness/exacerbation, and 3+ comorbidities: see above  are also affecting patient's functional outcome.   REHAB POTENTIAL: Fair time since onset  CLINICAL DECISION MAKING: Stable/uncomplicated  EVALUATION COMPLEXITY: Low  PLAN:  PT FREQUENCY: 2x/week  PT DURATION: 4 weeks  PLANNED INTERVENTIONS: Therapeutic exercises, Therapeutic activity, Neuromuscular re-education, Balance training, Gait training, Patient/Family education, Self Care, Joint mobilization, Stair training, Vestibular training, Visual/preceptual remediation/compensation, DME instructions, Aquatic Therapy, Dry Needling, Electrical stimulation, Spinal mobilization, Cryotherapy, Moist heat, Taping, Traction, Manual therapy, and Re-evaluation  PLAN FOR NEXT SESSION: how is HEP? Add to HEP as appropriate (address cervical ROM and ULNT), core strengthening/stability, progress to prone IYT for periscapular strengthening?    Westley Foots, PT, DPT, CBIS 03/30/2023, 9:37 AM

## 2023-04-03 ENCOUNTER — Ambulatory Visit: Payer: 59 | Admitting: Physical Therapy

## 2023-04-03 DIAGNOSIS — R293 Abnormal posture: Secondary | ICD-10-CM

## 2023-04-03 DIAGNOSIS — M6281 Muscle weakness (generalized): Secondary | ICD-10-CM

## 2023-04-03 DIAGNOSIS — M5416 Radiculopathy, lumbar region: Secondary | ICD-10-CM | POA: Diagnosis not present

## 2023-04-03 NOTE — Therapy (Signed)
OUTPATIENT PHYSICAL THERAPY NEURO TREATMENT - DISCHARGE NOTE   Patient Name: Cheryl White MRN: [id number] DOB:[birth year], 62 y.o., female Today's Date: 04/03/2023   PCP: Georgina Quint, MD REFERRING PROVIDER: Suanne Marker, MD   PHYSICAL THERAPY DISCHARGE SUMMARY  Visits from Start of Care: 6  Current functional level related to goals / functional outcomes: Independent   Remaining deficits: Ongoing chronic back pain   Education / Equipment: Handout for HEP   Patient agrees to discharge. Patient goals were partially met. Patient is being discharged due to being pleased with the current functional level.   END OF SESSION:  PT End of Session - 04/03/23 0846     Visit Number 6    Number of Visits 9    Date for PT Re-Evaluation 04/13/23    Authorization Type UHC    PT Start Time 0845    PT Stop Time 0910   d/c   PT Time Calculation (min) 25 min    Activity Tolerance Patient tolerated treatment well    Behavior During Therapy [facility] for tasks assessed/performed                Past Medical History:  Diagnosis Date   Lupus (HCC)    Raynaud's disease    Sjogren's disease (HCC)    Past Surgical History:  Procedure Laterality Date   BREAST SURGERY     CESAREAN SECTION     TUBAL LIGATION     Patient Active Problem List   Diagnosis Date Noted   Suspected sleep apnea 02/06/2023   History of thyroid nodule 09/18/2022   History of osteoporosis 09/18/2022   Memory difficulty 11/02/2017   Panic attacks 07/18/2017   GERD (gastroesophageal reflux disease) 07/03/2017   Lupus (systemic lupus erythematosus) (HCC) 05/09/2017   Raynaud disease 05/09/2017   Generalized anxiety disorder 05/09/2017    ONSET DATE: 03/01/2023 referral  REFERRING DIAG: M54.16 (ICD-10-CM) - Right lumbar radiculopathy   THERAPY DIAG:  Abnormal posture  Muscle weakness (generalized)  Rationale for Evaluation and Treatment: Rehabilitation  SUBJECTIVE:                                                                                                                                                                                              SUBJECTIVE STATEMENT: Pt states her pain has been better, "a lot better". Pt reports that when she overstretches she does feel a nerve pain in her back. Pt reports no pain at rest today. Pt indicates she has been having no pain in her neck or her shoulders recently. Pt did drive herself today, did not cause any pain  or cramps in her legs that she usually gets. Pt ok making today her last appointment.  Pt states that she has been doing her HEP as well as doing yoga, going to the senior center today (goes 3x/week does an adult exercise class, walks).   Pt accompanied by: self, interpreter Alba  PERTINENT HISTORY: Lupus, Raynauds, Sjogren's   PAIN:  Are you having pain? Yes: NPRS scale: 7/10 Pain location: low back, neck and "joints" Pain description: "pressure all the way down the back like a bruise" Aggravating factors: sitting for too long, standing for too long, walking too far Relieving factors: reports nothing  PRECAUTIONS: Fall   PATIENT GOALS: "to help with the pain"  OBJECTIVE:   DIAGNOSTIC FINDINGS: 01/27/23 c-spine xray:  IMPRESSION: Moderate degenerative disc disease in the mid and lower cervical spine with up to mild neuroforaminal stenosis.  MRI lumbar spine 02/05/23 IMPRESSION: This MRI of the lumbar spine without contrast shows the following: At L3-L4, degenerative changes cause borderline spinal stenosis and minimal lateral recess stenosis but no nerve root compression. At L4-L5, central disc protrusion and other degenerative changes cause is borderline spinal stenosis mild bilateral foraminal narrowing and moderate left greater than right lateral recess stenosis but no nerve root compression. At L5-S1, a small central disc extrusion and other degenerative changes cause moderate spinal stenosis, mild  foraminal narrowing and moderate right greater than left lateral recess stenosis though there is no definite nerve root compression.  TODAY'S TREATMENT:                                                                                                                               TherAct For LTG assessment: Oswestry: 29/50, 58% disability   PATIENT EDUCATION: Education details: continue HEP, plan to d/c from PT this session Person educated: Patient Education method: Explanation Education comprehension: verbalized understanding  HOME EXERCISE PROGRAM: Access Code: [email or web address] URL: [email or web address] Date: 03/19/2023 Prepared by: Peter Congo  Exercises - Open Book Chest Stretch on Towel Roll  - 1 x daily - 7 x weekly - 3 sets - 10 reps - Supine 90/90 Sciatic Nerve Glide with Knee Flexion/Extension  - 1 x daily - 7 x weekly - 1 sets - 5 reps - Supine Figure 4 Piriformis Stretch  - 1 x daily - 7 x weekly - 1 sets - 3-5 reps - 30 sec hold - Seated Scapular Retraction  - 1 x daily - 7 x weekly - 3 sets - 10 reps - Supine March with Posterior Pelvic Tilt  - 1 x daily - 7 x weekly - 3 sets - 10 reps - Supine Bridge  - 1 x daily - 7 x weekly - 3 sets - 10 reps - 5 sec hold - Seated Shoulder Flexion Full Range  - 1 x daily - 7 x weekly - 3 sets - 10 reps - Seated Shoulder Scaption  - 1 x daily - 7 x weekly - 3  sets - 10 reps - Seated Shoulder Abduction  - 1 x daily - 7 x weekly - 3 sets - 10 reps  GOALS: Goals reviewed with patient? Yes  SHORT TERM GOALS: =LTG based on PT POC length  LONG TERM GOALS: Target date: 04/13/23  Pt will be independent with final HEP for improved symptom report  Baseline: to be provided  Goal status: MET  2.  Patient will score </= 40% on oswestry to indicate reduction in symptoms Baseline: 46%, 58% (9/24) Goal status: NOT MET   ASSESSMENT:  CLINICAL IMPRESSION: Emphasis of skilled PT session on assessing LTG in preparation for d/c from  OPPT services this date due to resolution of pain symptoms and pt being pleased with her progress. Pt has met 1/2 LTG due to being independent with her final HEP, demonstrated increased disability level based on her score on the Oswestry but feels that she has improved and is ready for d/c.     OBJECTIVE IMPAIRMENTS: decreased knowledge of condition, decreased mobility, increased fascial restrictions, increased muscle spasms, impaired sensation, postural dysfunction, and pain.   ACTIVITY LIMITATIONS: carrying, lifting, sleeping, bed mobility, reach over head, hygiene/grooming, locomotion level, and caring for others  PARTICIPATION LIMITATIONS: cleaning, interpersonal relationship, driving, shopping, community activity, and occupation  PERSONAL FACTORS: Age, Past/current experiences, Time since onset of injury/illness/exacerbation, and 3+ comorbidities: see above  are also affecting patient's functional outcome.   REHAB POTENTIAL: Fair time since onset  CLINICAL DECISION MAKING: Stable/uncomplicated  EVALUATION COMPLEXITY: Low      Peter Congo, PT, DPT, CSRS  04/03/2023, 9:12 AM

## 2023-04-05 ENCOUNTER — Encounter: Payer: Self-pay | Admitting: Neurology

## 2023-04-05 ENCOUNTER — Ambulatory Visit: Payer: 59 | Admitting: Neurology

## 2023-04-05 ENCOUNTER — Ambulatory Visit: Payer: 59 | Admitting: Physical Therapy

## 2023-04-05 VITALS — BP 103/60 | HR 61 | Ht 61.0 in | Wt 119.0 lb

## 2023-04-05 DIAGNOSIS — Z9189 Other specified personal risk factors, not elsewhere classified: Secondary | ICD-10-CM | POA: Diagnosis not present

## 2023-04-05 DIAGNOSIS — M329 Systemic lupus erythematosus, unspecified: Secondary | ICD-10-CM

## 2023-04-05 DIAGNOSIS — R519 Headache, unspecified: Secondary | ICD-10-CM | POA: Diagnosis not present

## 2023-04-05 DIAGNOSIS — R002 Palpitations: Secondary | ICD-10-CM | POA: Diagnosis not present

## 2023-04-05 DIAGNOSIS — R0683 Snoring: Secondary | ICD-10-CM

## 2023-04-05 DIAGNOSIS — R351 Nocturia: Secondary | ICD-10-CM

## 2023-04-05 DIAGNOSIS — IMO0002 Reserved for concepts with insufficient information to code with codable children: Secondary | ICD-10-CM

## 2023-04-05 NOTE — Patient Instructions (Signed)
Thank you for choosing [facility] for your sleep related care! It was nice to meet you today!   Here is what we discussed today:    Based on your symptoms and your exam I believe you are at risk for obstructive sleep apnea (aka OSA). We should proceed with a sleep study to determine whether you do or do not have OSA and how severe it is. Even, if you have mild OSA, I may want you to consider treatment with CPAP, as treatment of even borderline or mild sleep apnea can result and improvement of symptoms such as sleep disruption, daytime sleepiness, nighttime bathroom breaks, restless leg symptoms, improvement of headache syndromes, even improved mood disorder.   As explained, an attended sleep study (meaning you get to stay overnight in the sleep lab), lets [place] monitor sleep-related behaviors such as sleep talking and leg movements in sleep, in addition to monitoring for sleep apnea.  A home sleep test is a screening tool for sleep apnea diagnosis only, but unfortunately, does not help with any other sleep-related diagnoses.  Please remember, the long-term risks and ramifications of untreated moderate to severe obstructive sleep apnea may include (but are not limited to): increased risk for cardiovascular disease, including congestive heart failure, stroke, difficult to control hypertension, treatment resistant obesity, arrhythmias, especially irregular heartbeat commonly known as A. Fib. (atrial fibrillation); even type 2 diabetes has been linked to untreated OSA.   Other correlations that untreated obstructive sleep apnea include macular edema which is swelling of the retina in the eyes, droopy eyelid syndrome, and elevated hemoglobin and hematocrit levels (often referred to as polycythemia).  Sleep apnea can cause disruption of sleep and sleep deprivation in most cases, which, in turn, can cause recurrent headaches, problems with memory, mood, concentration, focus, and vigilance. Most  people with untreated sleep apnea report excessive daytime sleepiness, which can affect their ability to drive. Please do not drive or use heavy equipment or machinery, if you feel sleepy! Patients with sleep apnea can also develop difficulty initiating and maintaining sleep (aka insomnia).   Having sleep apnea may increase your risk for other sleep disorders, including involuntary behaviors sleep such as sleep terrors, sleep talking, sleepwalking.    Having sleep apnea can also increase your risk for restless leg syndrome and leg movements at night.   Please note that untreated obstructive sleep apnea may carry additional perioperative morbidity. Patients with significant obstructive sleep apnea (typically, in the moderate to severe degree) should receive, if possible, perioperative PAP (positive airway pressure) therapy and the surgeons and particularly the anesthesiologists should be informed of the diagnosis and the severity of the sleep disordered breathing.   We will call you or email you through MyChart with regards to your test results and plan a follow-up in sleep clinic accordingly. Most likely, you will hear from one of our nurses.   Our sleep lab administrative assistant will call you to schedule your sleep study and give you further instructions, regarding the check in process for the sleep study, arrival time, what to bring, when you can expect to leave after the study, etc., and to answer any other logistical questions you may have. If you don't hear back from her by about 2 weeks from now, please feel free to call her direct line at [phone] or you can call our general clinic number, or email [place] through My Chart.

## 2023-04-05 NOTE — Progress Notes (Signed)
Subjective:    Patient ID: Cheryl White is a 62 y.o. female.  HPI    Huston Foley, MD, PhD [facility] [address] [address] [place], [place] [address]  Dear Dr. Alvy Bimler,  I saw your patient, Cheryl White, upon your kind request in my sleep clinic today for initial consultation of her sleep disorder, in particular, concern for underlying obstructive sleep apnea.  The patient is unaccompanied today and we utilized the video based interpretation service (interpreter is Gardiner Barefoot today).  As you know, Cheryl White is a 62 year old female with an underlying medical history of lupus, Raynaud's disease, Sjogren's disease, anxiety, lumbar radiculopathy (for which she has seen my colleague, Dr. Marjory Lies recently), who reports snoring and waking up with headaches as well as palpitations.  Sometimes she wakes up gasping, symptoms have been ongoing for 2 years but very intermittent, altogether had a few episodes last year and this year.  Her Epworth sleepiness score is 1 out of 24, fatigue severity score is 25 out of 63.  She is divorced for 20 years, has 3 grown children, lives alone.  Bedtime is generally around 9 PM and rise time around 6 AM.  She does have the TV on at night for white noise or uses the radio more commonly to have some background noise as she has a history of tinnitus and it helped to drown out the tinnitus.  She has woken up from her own snoring.  She has had a morning headache occasionally, not to the point of a migraine typically.  Overall, her migraines which she used to have in the past have improved.  I reviewed your office note from 02/06/2023. She has woken up with palpitations.  She drinks caffeine infrequently, usually decaf coffee and tea.  She does not drink any alcohol.  She is a non-smoker.  She wakes up in the early morning hours somewhere between 2:45 AM and 3 AM typically and is in the habit of praying at the time.  She does not send an alarm  for this, she will go back to bed after this.  She has done this since her childhood.  She has nocturia about once per average night.  She has a history of anxiety but currently does not take any medication for it, she had a prescription for hydroxyzine but had side effects she reports.  She does not have any pets in the household.  Her Past Medical History Is Significant For: Past Medical History:  Diagnosis Date   Lupus (HCC)    Raynaud's disease    Sjogren's disease (HCC)     Her Past Surgical History Is Significant For: Past Surgical History:  Procedure Laterality Date   BREAST SURGERY     CESAREAN SECTION     TUBAL LIGATION      Her Family History Is Significant For: Family History  Problem Relation Age of Onset   Diabetes Mother    Diabetes Father    Arthritis/Rheumatoid Father    Diabetes Brother    Heart attack Paternal Grandfather    Sleep apnea Neg Hx     Her Social History Is Significant For: Social History   Socioeconomic History   Marital status: Divorced    Spouse name: Not on file   Number of children: Not on file   Years of education: Not on file   Highest education level: Not on file  Occupational History   Occupation: Cleaning houses  Tobacco Use  Smoking status: Never   Smokeless tobacco: Never  Vaping Use   Vaping status: Never Used  Substance and Sexual Activity   Alcohol use: No   Drug use: No   Sexual activity: Not Currently    Comment: Has been Divorced for 12 years   Other Topics Concern   Not on file  Social History Narrative   Patient divorced 12 years. Patient had three children - two boys and 1 girl in there 56s- 30s. Eldest is  studying education in [place] - getting PHD.    Patient lives on her own in [place].    Social Determinants of Health   Financial Resource Strain: Not on file  Food Insecurity: Low Risk  (01/25/2023)   Received from [facility]   Hunger Vital Sign    Worried About Running Out of Food in the Last  Year: Never true    Ran Out of Food in the Last Year: Never true  Transportation Needs: Not on file (01/25/2023)  Physical Activity: Not on file  Stress: Not on file  Social Connections: Not on file    Her Allergies Are:  Allergies  Allergen Reactions   Lidocaine Shortness Of Breath    Allergy actually to Septocaine, not Lidocaine. Palpation and SOB   Other Anaphylaxis   Shrimp Extract Anaphylaxis   Iodinated Contrast Media Other (See Comments) and Rash   Shellfish Allergy   :   Her Current Medications Are:  Outpatient Encounter Medications as of 04/05/2023  Medication Sig   albuterol (VENTOLIN HFA) 108 (90 Base) MCG/ACT inhaler Inhale into the lungs.   alendronate (FOSAMAX) 70 MG tablet Take 70 mg by mouth once a week.   cetirizine (ZYRTEC ALLERGY) 10 MG tablet Take 1 tablet (10 mg total) by mouth daily.   ciclopirox (PENLAC) 8 % solution Apply topically at bedtime. Apply over nail and surrounding skin. Apply daily over previous coat. After seven (7) days, may remove with alcohol and continue cycle.   fluticasone-salmeterol (ADVAIR) 250-50 MCG/ACT AEPB Inhale 1 puff into the lungs 2 (two) times daily.   hydroxychloroquine (PLAQUENIL) 200 MG tablet Take by mouth daily.   promethazine-dextromethorphan (PROMETHAZINE-DM) 6.25-15 MG/5ML syrup Take 5 mLs by mouth 3 (three) times daily as needed for cough.   pseudoephedrine (SUDAFED) 30 MG tablet Take 1 tablet (30 mg total) by mouth every 8 (eight) hours as needed for congestion.   ALPRAZolam (XANAX) 0.5 MG tablet for sedation before MRI scan; take 1 tab 1 hour before scan; may repeat 1 tab 15 min before scan (Patient not taking: Reported on 02/06/2023)   benzonatate (TESSALON) 100 MG capsule Take 100 mg by mouth as needed for cough. (Patient not taking: Reported on 03/09/2023)   gabapentin (NEURONTIN) 300 MG capsule Take 1 capsule (300 mg total) by mouth at bedtime. (Patient not taking: Reported on 02/06/2023)   hydrOXYzine (ATARAX/VISTARIL)  10 MG tablet Take 1 tablet (10 mg total) by mouth 3 (three) times daily as needed. (Patient not taking: Reported on 02/06/2023)   omeprazole (PRILOSEC) 40 MG capsule Take 40 mg by mouth daily. (Patient not taking: Reported on 02/06/2023)   predniSONE (DELTASONE) 50 MG tablet Take 1 tablet (50 mg total) by mouth daily with breakfast. (Patient not taking: Reported on 02/06/2023)   No facility-administered encounter medications on file as of 04/05/2023.  :   Review of Systems:  Out of a complete 14 point review of systems, all are reviewed and negative with the exception of these symptoms as listed  below:  Review of Systems  Neurological:        Pt here for sleep consult  Pt snores,headaches, Pt denies hypertension,fatigue,sleep study,CPAP  Pt states she has increased anxiety   ESS:1 FSS:25    Objective:  Neurological Exam  Physical Exam Physical Examination:   Vitals:   04/05/23 0955  BP: 103/60  Pulse: 61    General Examination: The patient is a very pleasant 62 y.o. female in no acute distress. She appears well-developed and well-nourished and well groomed.   HEENT: Normocephalic, atraumatic, pupils are equal, round and reactive to light, extraocular tracking is good without limitation to gaze excursion or nystagmus noted. Hearing is grossly intact. Face is symmetric with normal facial animation. Speech is clear with no dysarthria noted. There is no hypophonia. There is no lip, neck/head, jaw or voice tremor. Neck is supple with full range of passive and active motion. There are no carotid bruits on auscultation. Oropharynx exam reveals: mild mouth dryness, adequate dental hygiene and mild airway crowding, due to smaller airway entry, slightly wider uvula, Mallampati class II, tonsils small, tongue protrudes centrally and palate elevates symmetrically, neck circumference 11 three-quarter inches, moderate overbite noted.  Intermittent lower facial twitching noted, almost tic-like,  excessive lower facial grimacing, no hemifacial spasm.  Chest: Clear to auscultation without wheezing, rhonchi or crackles noted.  Heart: S1+S2+0, regular and normal without murmurs, rubs or gallops noted.   Abdomen: Soft, non-tender and non-distended.  Extremities: There is no pitting edema in the distal lower extremities bilaterally.   Skin: Warm and dry without trophic changes noted.   Musculoskeletal: exam reveals no obvious joint deformities.   Neurologically:  Mental status: The patient is awake, alert and oriented in all 4 spheres. Her immediate and remote memory, attention, language skills and fund of knowledge are appropriate. There is no evidence of aphasia, agnosia, apraxia or anomia. Speech is clear with normal prosody and enunciation. Thought process is linear. Mood is normal and affect is normal.  Cranial nerves II - XII are as described above under HEENT exam.  Motor exam: Normal bulk, strength and tone is noted. There is no obvious action or resting tremor.  Fine motor skills and coordination: grossly intact.  Cerebellar testing: No dysmetria or intention tremor. There is no truncal or gait ataxia.  Sensory exam: intact to light touch in the upper and lower extremities.  Gait, station and balance: She stands easily. No veering to one side is noted. No leaning to one side is noted. Posture is age-appropriate and stance is narrow based. Gait shows normal stride length and normal pace. No problems turning are noted.   Assessment and Plan:  In summary, Cheryl White is a very pleasant 63 y.o.-year old female with an underlying medical history of lupus, Raynaud's disease, Sjogren's disease, anxiety, lumbar radiculopathy (for which she has seen my colleague, Dr. Marjory Lies recently), whose history and physical exam are concerning for sleep disordered breathing, particularly obstructive sleep apnea (OSA). A laboratory attended sleep study is typically considered "gold standard" for  evaluation of sleep disordered breathing.   I had a long chat with the patient about my findings and the diagnosis of sleep apnea, particularly OSA, its prognosis and treatment options. We talked about medical/conservative treatments, surgical interventions and non-pharmacological approaches for symptom control. I explained, in particular, the risks and ramifications of untreated moderate to severe OSA, especially with respect to developing cardiovascular disease down the road, including congestive heart failure (CHF), difficult to treat hypertension, cardiac  arrhythmias (particularly A-fib), neurovascular complications including TIA, stroke and dementia. Even type 2 diabetes has, in part, been linked to untreated OSA. Symptoms of untreated OSA may include (but may not be limited to) daytime sleepiness, nocturia (i.e. frequent nighttime urination), memory problems, mood irritability and suboptimally controlled or worsening mood disorder such as depression and/or anxiety, lack of energy, lack of motivation, physical discomfort, as well as recurrent headaches, especially morning or nocturnal headaches. We talked about the importance of maintaining a healthy lifestyle and striving for healthy weight. In addition, we talked about the importance of striving for and maintaining good sleep hygiene. I recommended a sleep study at this time. I outlined the differences between a laboratory attended sleep study which is considered more comprehensive and accurate over the option of a home sleep test (HST); the latter may lead to underestimation of sleep disordered breathing in some instances and does not help with diagnosing upper airway resistance syndrome and is not accurate enough to diagnose primary central sleep apnea typically. I outlined possible surgical and non-surgical treatment options of OSA, including the use of a positive airway pressure (PAP) device (i.e. CPAP, AutoPAP/APAP or BiPAP in certain circumstances),  a custom-made dental device (aka oral appliance, which would require a referral to a specialist dentist or orthodontist typically, and is generally speaking not considered for patients with full dentures or edentulous state), upper airway surgical options, such as traditional UPPP (which is not considered a first-line treatment) or the Inspire device (hypoglossal nerve stimulator, which would involve a referral for consultation with an ENT surgeon, after careful selection, following inclusion criteria - also not first-line treatment). I explained the PAP treatment option to the patient in detail, as this is generally considered first-line treatment.  The patient indicated that she would be willing to try PAP therapy, if the need arises. We will pick up our discussion about the next steps and treatment options after testing.  We will keep her posted as to the test results by phone call and/or MyChart messaging where possible.  We will plan to follow-up in sleep clinic accordingly as well.  I answered all her questions today and the patient was in agreement.   I encouraged her to call with any interim questions, concerns, problems or updates or email [place] through MyChart.  Generally speaking, sleep test authorizations may take up to 2 weeks, sometimes less, sometimes longer, the patient is encouraged to get in touch with [place] if they do not hear back from the sleep lab staff directly within the next 2 weeks.  Thank you very much for allowing me to participate in the care of this nice patient. If I can be of any further assistance to you please do not hesitate to call me at [phone].  Sincerely,   Huston Foley, MD, PhD

## 2023-04-10 ENCOUNTER — Ambulatory Visit: Payer: 59 | Admitting: Physical Therapy

## 2023-04-13 ENCOUNTER — Ambulatory Visit: Payer: 59

## 2023-04-19 ENCOUNTER — Encounter: Payer: Self-pay | Admitting: Emergency Medicine

## 2023-04-21 NOTE — Telephone Encounter (Signed)
I do not think we can do this.  There are specific doctors that deal with the immigration/citizenship process.  I may be wrong on this.  However #4 is not something I can certify.  She does not have a disability that prevents her from learning English and be able to communicate and understand. Thanks.

## 2023-04-24 ENCOUNTER — Ambulatory Visit (INDEPENDENT_AMBULATORY_CARE_PROVIDER_SITE_OTHER): Payer: 59 | Admitting: Emergency Medicine

## 2023-04-24 ENCOUNTER — Encounter: Payer: Self-pay | Admitting: Emergency Medicine

## 2023-04-24 VITALS — BP 112/72 | HR 60 | Temp 97.8°F | Ht 61.0 in | Wt 117.6 lb

## 2023-04-24 DIAGNOSIS — F411 Generalized anxiety disorder: Secondary | ICD-10-CM | POA: Diagnosis not present

## 2023-04-24 DIAGNOSIS — L239 Allergic contact dermatitis, unspecified cause: Secondary | ICD-10-CM

## 2023-04-24 DIAGNOSIS — Z87898 Personal history of other specified conditions: Secondary | ICD-10-CM | POA: Diagnosis not present

## 2023-04-24 DIAGNOSIS — Z1231 Encounter for screening mammogram for malignant neoplasm of breast: Secondary | ICD-10-CM

## 2023-04-24 MED ORDER — TRIAMCINOLONE ACETONIDE 0.1 % EX CREA
1.0000 | TOPICAL_CREAM | Freq: Two times a day (BID) | CUTANEOUS | 0 refills | Status: AC
Start: 2023-04-24 — End: ?

## 2023-04-24 NOTE — Progress Notes (Signed)
[facility] 62 y.o.   Chief Complaint  Patient presents with   Breast Problem    Right breast, itching/heat x3 weeks. Currently treating with topical creams. PT notes of anxiety issues    HISTORY OF PRESENT ILLNESS: This is a 62 y.o. female complaining of itchy rash to right breast that started 2 to 3 weeks ago Denies pain or any other associated symptoms. History of generalized anxiety disorder.  Requesting referral to psychiatrist. No other complaints or medical concerns today.  HPI   Prior to Admission medications   Medication Sig Start Date End Date Taking? Authorizing Provider  fluticasone-salmeterol (ADVAIR) 250-50 MCG/ACT AEPB Inhale 1 puff into the lungs 2 (two) times daily. 09/29/22  Yes [provider]  hydroxychloroquine (PLAQUENIL) 200 MG tablet Take by mouth daily.   Yes [provider]  triamcinolone cream (KENALOG) 0.1 % Apply 1 Application topically 2 (two) times daily. 04/24/23  Yes Jocee Kissick, Eilleen Kempf, MD  albuterol (VENTOLIN HFA) 108 (90 Base) MCG/ACT inhaler Inhale into the lungs. Patient not taking: Reported on 04/24/2023 09/29/22   [provider]  alendronate (FOSAMAX) 70 MG tablet Take 70 mg by mouth once a week. Patient not taking: Reported on 04/24/2023    [provider]  ALPRAZolam Prudy Feeler) 0.5 MG tablet for sedation before MRI scan; take 1 tab 1 hour before scan; may repeat 1 tab 15 min before scan Patient not taking: Reported on 02/06/2023 01/09/23   Penumalli, Glenford Bayley, MD  benzonatate (TESSALON) 100 MG capsule Take 100 mg by mouth as needed for cough. Patient not taking: Reported on 03/09/2023    [provider]  cetirizine (ZYRTEC ALLERGY) 10 MG tablet Take 1 tablet (10 mg total) by mouth daily. Patient not taking: Reported on 04/24/2023 03/11/23   Wallis Bamberg, PA-C  ciclopirox [facility]) 8 % solution Apply topically at bedtime. Apply over nail and surrounding skin. Apply daily over previous coat. After seven  (7) days, may remove with alcohol and continue cycle. Patient not taking: Reported on 04/24/2023 02/06/23   Georgina Quint, MD  gabapentin (NEURONTIN) 300 MG capsule Take 1 capsule (300 mg total) by mouth at bedtime. Patient not taking: Reported on 02/06/2023 01/27/23   Guy Sandifer L, PA  hydrOXYzine (ATARAX/VISTARIL) 10 MG tablet Take 1 tablet (10 mg total) by mouth 3 (three) times daily as needed. Patient not taking: Reported on 02/06/2023 07/18/17   Berton Bon, MD  omeprazole (PRILOSEC) 40 MG capsule Take 40 mg by mouth daily. Patient not taking: Reported on 02/06/2023 09/29/22   [provider]  predniSONE (DELTASONE) 50 MG tablet Take 1 tablet (50 mg total) by mouth daily with breakfast. Patient not taking: Reported on 02/06/2023 01/27/23   Guy Sandifer L, PA  promethazine-dextromethorphan (PROMETHAZINE-DM) 6.25-15 MG/5ML syrup Take 5 mLs by mouth 3 (three) times daily as needed for cough. Patient not taking: Reported on 04/24/2023 03/11/23   Wallis Bamberg, PA-C  pseudoephedrine (SUDAFED) 30 MG tablet Take 1 tablet (30 mg total) by mouth every 8 (eight) hours as needed for congestion. Patient not taking: Reported on 04/24/2023 03/11/23   Wallis Bamberg, PA-C    Allergies  Allergen Reactions   Lidocaine Shortness Of Breath    Allergy actually to Septocaine, not Lidocaine. Palpation and SOB   Other Anaphylaxis   Shrimp Extract Anaphylaxis   Iodinated Contrast Media Other (See Comments) and Rash   Shellfish Allergy     Patient Active Problem List   Diagnosis Date Noted   Suspected  sleep apnea 02/06/2023   History of thyroid nodule 09/18/2022   History of osteoporosis 09/18/2022   Memory difficulty 11/02/2017   Panic attacks 07/18/2017   GERD (gastroesophageal reflux disease) 07/03/2017   Lupus (systemic lupus erythematosus) (HCC) 05/09/2017   Raynaud disease 05/09/2017   Generalized anxiety disorder 05/09/2017    Past Medical History:  Diagnosis Date   Lupus     Raynaud's disease    Sjogren's disease (HCC)     Past Surgical History:  Procedure Laterality Date   BREAST SURGERY     CESAREAN SECTION     TUBAL LIGATION      Social History   Socioeconomic History   Marital status: Divorced    Spouse name: Not on file   Number of children: Not on file   Years of education: Not on file   Highest education level: Not on file  Occupational History   Occupation: Cleaning houses  Tobacco Use   Smoking status: Never   Smokeless tobacco: Never  Vaping Use   Vaping status: Never Used  Substance and Sexual Activity   Alcohol use: No   Drug use: No   Sexual activity: Not Currently    Comment: Has been Divorced for 12 years   Other Topics Concern   Not on file  Social History Narrative   Patient divorced 12 years. Patient had three children - two boys and 1 girl in there 44s- 30s. Eldest is  studying education in [place] - getting PHD.    Patient lives on her own in [place].    Social Determinants of Health   Financial Resource Strain: Not on file  Food Insecurity: Low Risk  (01/25/2023)   Received from [facility]   Hunger Vital Sign    Worried About Running Out of Food in the Last Year: Never true    Ran Out of Food in the Last Year: Never true  Transportation Needs: Not on file (01/25/2023)  Physical Activity: Not on file  Stress: Not on file  Social Connections: Not on file  Intimate Partner Violence: Not on file    Family History  Problem Relation Age of Onset   Diabetes Mother    Diabetes Father    Arthritis/Rheumatoid Father    Diabetes Brother    Heart attack Paternal Grandfather    Sleep apnea Neg Hx      Review of Systems  Constitutional: Negative.  Negative for chills and fever.  HENT: Negative.  Negative for congestion and sore throat.   Respiratory: Negative.  Negative for cough and shortness of breath.   Cardiovascular: Negative.  Negative for chest pain and palpitations.  Gastrointestinal:  Negative for  abdominal pain, diarrhea, nausea and vomiting.  Genitourinary: Negative.  Negative for dysuria and hematuria.  Skin:  Positive for itching and rash.  Neurological: Negative.  Negative for dizziness and headaches.  All other systems reviewed and are negative.   Vitals:   04/24/23 1006  BP: 112/72  Pulse: 60  Temp: 97.8 F (36.6 C)  SpO2: 99%    Physical Exam Vitals reviewed.  Constitutional:      Appearance: Normal appearance.  HENT:     Head: Normocephalic.  Eyes:     Extraocular Movements: Extraocular movements intact.  Cardiovascular:     Rate and Rhythm: Normal rate and regular rhythm.     Pulses: Normal pulses.     Heart sounds: Normal heart sounds.  Pulmonary:     Effort: Pulmonary effort is normal.  Breath sounds: Normal breath sounds.  Skin:    General: Skin is warm and dry.     Capillary Refill: Capillary refill takes less than 2 seconds.     Comments: Erythematous rash to lateral aspect of breast area, mostly chest  Neurological:     General: No focal deficit present.     Mental Status: She is alert and oriented to person, place, and time.  Psychiatric:        Mood and Affect: Mood normal.        Behavior: Behavior normal.      ASSESSMENT & PLAN: A total of 33 minutes was spent with the patient and counseling/coordination of care regarding preparing for this visit, review of most recent office visit notes, review of past medical history, review of all medications, differential diagnosis of rash and management, need for screening mammogram, stress management and need for psychiatry evaluation, prognosis, documentation and need for follow-up.  Problem List Items Addressed This Visit       Musculoskeletal and Integument   Allergic dermatitis - Primary    Unresponsive to over-the-counter medication Recommend triamcinolone cream Differential diagnosis discussed      Relevant Medications   triamcinolone cream (KENALOG) 0.1 %     Other   Generalized  anxiety disorder    Active and affecting quality of life. Did not tolerate Cymbalta in the past Recommend psychiatry evaluation Referral placed today      Relevant Orders   Ambulatory referral to Psychiatry   History of breast problem    Normal mammogram last year. Due for mammogram this year Referral placed      Relevant Orders   Ambulatory referral to Gynecology   Other Visit Diagnoses     Screening mammogram for breast cancer       Relevant Orders   MM Digital Screening      Patient Instructions  Mantenimiento de la salud en las mujeres Health Maintenance, Female Adoptar un estilo de vida saludable y recibir atencin preventiva son importantes para promover la salud y Counsellor. Consulte al mdico sobre: El esquema adecuado para hacerse pruebas y exmenes peridicos. Cosas que puede hacer por su cuenta para prevenir enfermedades y [place] sano. Qu debo saber sobre la dieta, el peso y el ejercicio? Consuma una dieta saludable  Consuma una dieta que incluya muchas verduras, frutas, productos lcteos con bajo contenido de [place] y Associate Professor. No consuma muchos alimentos ricos en grasas slidas, azcares agregados o sodio. Mantenga un peso saludable El ndice de masa muscular [facility]) se [place] para identificar problemas de [place]. Proporciona una estimacin de la grasa corporal basndose en el peso y la altura. Su mdico puede ayudarle a Engineer, site IMC y a Personnel officer o Pharmacologist un peso saludable. Haga ejercicio con regularidad Haga ejercicio con regularidad. Esta es una de las prcticas ms importantes que puede hacer por su salud. La [employer] de los adultos deben seguir estas pautas: Education officer, environmental, al menos, 150 minutos de actividad fsica por semana. El ejercicio debe aumentar la frecuencia cardaca y Media planner transpirar (ejercicio de intensidad moderada). Hacer ejercicios de fortalecimiento por lo [employer] por semana. Agregue esto a su plan de ejercicio de  intensidad moderada. Pase menos tiempo sentada. Incluso la actividad fsica ligera puede ser beneficiosa. Controle sus niveles de colesterol y lpidos en la sangre Comience a realizarse anlisis de lpidos y Oncologist en la sangre a los 20 aos y luego reptalos cada 5 aos. Hgase controlar los niveles de colesterol con mayor  frecuencia si: Sus niveles de lpidos y colesterol son altos. Es mayor de 40 aos. Presenta un alto riesgo de padecer enfermedades cardacas. Qu debo saber sobre las pruebas de deteccin del cncer? Segn su historia clnica y sus antecedentes familiares, es posible que deba realizarse pruebas de deteccin del cncer en diferentes edades. Esto puede incluir pruebas de deteccin de lo siguiente: Cncer de mama. Cncer de cuello uterino. Cncer colorrectal. Cncer de piel. Cncer de pulmn. Qu debo saber sobre la enfermedad cardaca, la diabetes y la hipertensin arterial? Presin arterial y enfermedad cardaca La hipertensin arterial causa enfermedades cardacas y [place] el riesgo de accidente cerebrovascular. Es ms probable que esto se manifieste en las personas que tienen lecturas de presin arterial alta o tienen sobrepeso. Hgase controlar la presin arterial: Cada 3 a 5 aos si tiene entre 18 y 23 aos. Todos los aos si es mayor de 40 aos. Diabetes Realcese exmenes de deteccin de la diabetes con regularidad. Este anlisis revisa el nivel de azcar en la sangre en [place]. Hgase las pruebas de deteccin: Cada tres aos despus de los 40 aos de edad si tiene un peso normal y un bajo riesgo de padecer diabetes. Con ms frecuencia y a partir de [place] edad inferior si tiene sobrepeso o un alto riesgo de padecer diabetes. Qu debo saber sobre la prevencin de infecciones? Hepatitis B Si tiene un riesgo ms alto de contraer hepatitis B, debe someterse a un examen de deteccin de este virus. Hable con el mdico para averiguar si tiene riesgo de contraer la  infeccin por hepatitis B. Hepatitis C Se recomienda el anlisis a: [employer] 1945 y 1965. Todas las personas que tengan un riesgo de haber contrado hepatitis C. Enfermedades de transmisin sexual (ETS) Hgase las pruebas de Airline pilot de ITS, incluidas la gonorrea y la clamidia, si: Es sexualmente activa y es menor de [address]. Es mayor de [address], y Public affairs consultant informa que corre riesgo de tener este tipo de infecciones. La actividad sexual ha cambiado desde que le hicieron la ltima prueba de deteccin y tiene un riesgo mayor de Warehouse manager clamidia o Copy. Pregntele al mdico si usted tiene riesgo. Pregntele al mdico si usted tiene un alto riesgo de Primary school teacher VIH. El mdico tambin puede recomendarle un medicamento recetado para ayudar a evitar la infeccin por el VIH. Si elige tomar medicamentos para prevenir el VIH, primero debe [employer] de deteccin del VIH. Luego debe hacerse anlisis cada 3 meses mientras est tomando los medicamentos. Embarazo Si est por dejar de Armed forces training and education officer (fase premenopusica) y usted puede quedar [place], busque asesoramiento antes de [place]. Tome de 400 a 800 microgramos (mcg) de cido [employer] si [place]. Pida mtodos de control de la natalidad (anticonceptivos) si desea evitar un embarazo no deseado. Osteoporosis y [place] La osteoporosis es una enfermedad en la que los huesos pierden los minerales y la fuerza por el avance de la edad. El resultado pueden ser fracturas en los [place]. Si tiene 65 aos o ms, o si est en riesgo de sufrir osteoporosis y fracturas, pregunte a su mdico si debe: Hacerse pruebas de deteccin de prdida sea. Tomar un suplemento de calcio o de vitamina D para reducir el riesgo de fracturas. Recibir terapia de reemplazo hormonal (TRH) para tratar los sntomas de la menopausia. Siga estas indicaciones en su casa: Consumo de alcohol No beba alcohol si: Su mdico le  indica no hacerlo. Est embarazada, puede estar embarazada  o est tratando de quedar embarazada. Si bebe alcohol: Limite la cantidad que bebe a lo siguiente: De 0 a 1 bebida por da. Sepa cunta cantidad de alcohol hay en las bebidas que toma. En los [address], una medida equivale a una botella de cerveza de 12 oz (355 ml), un vaso de vino de 5 oz (148 ml) o un vaso de una bebida alcohlica de alta graduacin de 1 oz (44 ml). Estilo de vida No consuma ningn producto que contenga nicotina o tabaco. Estos productos incluyen cigarrillos, tabaco para Theatre manager y aparatos de vapeo, como los Administrator, Civil Service. Si necesita ayuda para dejar de consumir estos productos, consulte al mdico. No consuma drogas. No comparta agujas. Solicite ayuda a su mdico si necesita apoyo o informacin para abandonar las drogas. Indicaciones generales Realcese los estudios de rutina de [address], dentales y de Wellsite geologist. Mantngase al da con las vacunas. Infrmele a su mdico si: Se siente deprimida con frecuencia. Alguna vez ha sido vctima de [place] o no se siente seguro en su casa. Resumen Adoptar un estilo de vida saludable y recibir atencin preventiva son importantes para promover la salud y Counsellor. Siga las instrucciones del mdico acerca de una dieta saludable, el ejercicio y la realizacin de pruebas o exmenes para Hotel manager. Siga las instrucciones del mdico con respecto al control del colesterol y la presin arterial. Esta informacin no tiene Theme park manager el consejo del mdico. Asegrese de hacerle al mdico cualquier pregunta que tenga. Document Revised: 12/02/2020 Document Reviewed: 12/02/2020 Elsevier Patient Education  2024 Elsevier Inc.    Edwina Barth, MD [facility]

## 2023-04-24 NOTE — Assessment & Plan Note (Signed)
Active and affecting quality of life. Did not tolerate Cymbalta in the past Recommend psychiatry evaluation Referral placed today

## 2023-04-24 NOTE — Assessment & Plan Note (Signed)
Unresponsive to over-the-counter medication Recommend triamcinolone cream Differential diagnosis discussed

## 2023-04-24 NOTE — Assessment & Plan Note (Signed)
Normal mammogram last year. Due for mammogram this year Referral placed

## 2023-04-24 NOTE — Patient Instructions (Signed)
Mantenimiento de Radiographer, therapeutic en las mujeres Health Maintenance, Female Adoptar un estilo de vida saludable y recibir atencin preventiva son importantes para promover la salud y Counsellor. Consulte al mdico sobre: El esquema adecuado para hacerse pruebas y exmenes peridicos. Cosas que puede hacer por su cuenta para prevenir enfermedades y [place] sano. Qu debo saber sobre la dieta, el peso y el ejercicio? Consuma una dieta saludable  Consuma una dieta que incluya muchas verduras, frutas, productos lcteos con bajo contenido de [place] y Associate Professor. No consuma muchos alimentos ricos en grasas slidas, azcares agregados o sodio. Mantenga un peso saludable El ndice de masa muscular [facility]) se [place] para identificar problemas de [place]. Proporciona una estimacin de la grasa corporal basndose en el peso y la altura. Su mdico puede ayudarle a Engineer, site IMC y a Personnel officer o Pharmacologist un peso saludable. Haga ejercicio con regularidad Haga ejercicio con regularidad. Esta es una de las prcticas ms importantes que puede hacer por su salud. La [employer] de los adultos deben seguir estas pautas: Education officer, environmental, al menos, 150 minutos de actividad fsica por semana. El ejercicio debe aumentar la frecuencia cardaca y Media planner transpirar (ejercicio de intensidad moderada). Hacer ejercicios de fortalecimiento por lo [employer] por semana. Agregue esto a su plan de ejercicio de intensidad moderada. Pase menos tiempo sentada. Incluso la actividad fsica ligera puede ser beneficiosa. Controle sus niveles de colesterol y lpidos en la sangre Comience a realizarse anlisis de lpidos y Oncologist en la sangre a los 20 aos y luego reptalos cada 5 aos. Hgase controlar los niveles de colesterol con mayor frecuencia si: Sus niveles de lpidos y colesterol son altos. Es mayor de 40 aos. Presenta un alto riesgo de padecer enfermedades cardacas. Qu debo saber sobre las pruebas de deteccin del  cncer? Segn su historia clnica y sus antecedentes familiares, es posible que deba realizarse pruebas de deteccin del cncer en diferentes edades. Esto puede incluir pruebas de deteccin de lo siguiente: Cncer de mama. Cncer de cuello uterino. Cncer colorrectal. Cncer de piel. Cncer de pulmn. Qu debo saber sobre la enfermedad cardaca, la diabetes y la hipertensin arterial? Presin arterial y enfermedad cardaca La hipertensin arterial causa enfermedades cardacas y [place] el riesgo de accidente cerebrovascular. Es ms probable que esto se manifieste en las personas que tienen lecturas de presin arterial alta o tienen sobrepeso. Hgase controlar la presin arterial: Cada 3 a 5 aos si tiene entre 18 y 21 aos. Todos los aos si es mayor de 40 aos. Diabetes Realcese exmenes de deteccin de la diabetes con regularidad. Este anlisis revisa el nivel de azcar en la sangre en [place]. Hgase las pruebas de deteccin: Cada tres aos despus de los 40 aos de edad si tiene un peso normal y un bajo riesgo de padecer diabetes. Con ms frecuencia y a partir de [place] edad inferior si tiene sobrepeso o un alto riesgo de padecer diabetes. Qu debo saber sobre la prevencin de infecciones? Hepatitis B Si tiene un riesgo ms alto de contraer hepatitis B, debe someterse a un examen de deteccin de este virus. Hable con el mdico para averiguar si tiene riesgo de contraer la infeccin por hepatitis B. Hepatitis C Se recomienda el anlisis a: [employer] 1945 y 1965. Todas las personas que tengan un riesgo de haber contrado hepatitis C. Enfermedades de transmisin sexual (ETS) Hgase las pruebas de Airline pilot de ITS, incluidas la gonorrea y la clamidia, si: Es sexualmente activa y es Adult nurse de 24  aos. Es mayor de 24 aos, y el mdico le informa que corre riesgo de tener este tipo de infecciones. La actividad sexual ha cambiado desde que le hicieron la ltima prueba de  deteccin y tiene un riesgo mayor de Warehouse manager clamidia o Copy. Pregntele al mdico si usted tiene riesgo. Pregntele al mdico si usted tiene un alto riesgo de Primary school teacher VIH. El mdico tambin puede recomendarle un medicamento recetado para ayudar a evitar la infeccin por el VIH. Si elige tomar medicamentos para prevenir el VIH, primero debe [employer] de deteccin del VIH. Luego debe hacerse anlisis cada 3 meses mientras est tomando los medicamentos. Embarazo Si est por dejar de Armed forces training and education officer (fase premenopusica) y usted puede quedar [place], busque asesoramiento antes de [place]. Tome de 400 a 800 microgramos (mcg) de cido [employer] si [place]. Pida mtodos de control de la natalidad (anticonceptivos) si desea evitar un embarazo no deseado. Osteoporosis y [place] La osteoporosis es una enfermedad en la que los huesos pierden los minerales y la fuerza por el avance de la edad. El resultado pueden ser fracturas en los [place]. Si tiene 65 aos o ms, o si est en riesgo de sufrir osteoporosis y fracturas, pregunte a su mdico si debe: Hacerse pruebas de deteccin de prdida sea. Tomar un suplemento de calcio o de vitamina D para reducir el riesgo de fracturas. Recibir terapia de reemplazo hormonal (TRH) para tratar los sntomas de la menopausia. Siga estas indicaciones en su casa: Consumo de alcohol No beba alcohol si: Su mdico le indica no hacerlo. Est embarazada, puede estar embarazada o est tratando de [place]. Si bebe alcohol: Limite la cantidad que bebe a lo siguiente: De 0 a 1 bebida por da. Sepa cunta cantidad de alcohol hay en las bebidas que toma. En los [address], una medida equivale a una botella de cerveza de 12 oz (355 ml), un vaso de vino de 5 oz (148 ml) o un vaso de una bebida alcohlica de alta graduacin de 1 oz (44 ml). Estilo de vida No consuma ningn producto que contenga nicotina o tabaco. Estos  productos incluyen cigarrillos, tabaco para Theatre manager y aparatos de vapeo, como los Administrator, Civil Service. Si necesita ayuda para dejar de consumir estos productos, consulte al mdico. No consuma drogas. No comparta agujas. Solicite ayuda a su mdico si necesita apoyo o informacin para abandonar las drogas. Indicaciones generales Realcese los estudios de rutina de [address], dentales y de Wellsite geologist. Mantngase al da con las vacunas. Infrmele a su mdico si: Se siente deprimida con frecuencia. Alguna vez ha sido vctima de [place] o no se siente seguro en su casa. Resumen Adoptar un estilo de vida saludable y recibir atencin preventiva son importantes para promover la salud y Counsellor. Siga las instrucciones del mdico acerca de una dieta saludable, el ejercicio y la realizacin de pruebas o exmenes para Hotel manager. Siga las instrucciones del mdico con respecto al control del colesterol y la presin arterial. Esta informacin no tiene Theme park manager el consejo del mdico. Asegrese de hacerle al mdico cualquier pregunta que tenga. Document Revised: 12/02/2020 Document Reviewed: 12/02/2020 Elsevier Patient Education  2024 [employer].

## 2023-04-27 ENCOUNTER — Encounter: Payer: Self-pay | Admitting: Neurology

## 2023-05-04 ENCOUNTER — Encounter: Payer: Self-pay | Admitting: Diagnostic Neuroimaging

## 2023-05-04 ENCOUNTER — Ambulatory Visit (INDEPENDENT_AMBULATORY_CARE_PROVIDER_SITE_OTHER): Payer: 59 | Admitting: Diagnostic Neuroimaging

## 2023-05-04 VITALS — BP 105/59 | HR 57 | Ht 61.0 in | Wt 118.2 lb

## 2023-05-04 DIAGNOSIS — M5416 Radiculopathy, lumbar region: Secondary | ICD-10-CM | POA: Diagnosis not present

## 2023-05-04 DIAGNOSIS — G5139 Clonic hemifacial spasm, unspecified: Secondary | ICD-10-CM | POA: Diagnosis not present

## 2023-05-04 NOTE — Patient Instructions (Signed)
  INTERMITTENT RIGHT > LEFT FACIAL SPASMS - monitor for now; consider MRI brain if it returns  RIGHT LOW BACK PAIN --> radiating to the right leg and foot (since ~2020) - continue PT evaluation / exercises - consider pain mgmt clinic, spine clinic eval if symptoms worsen

## 2023-05-04 NOTE — Progress Notes (Signed)
[facility]  PATIENT: Cheryl White DOB: 01-Apr-1961  REFERRING CLINICIAN: Georgina Quint, * HISTORY FROM: patient REASON FOR VISIT: new consult   HISTORICAL  CHIEF COMPLAINT:  Chief Complaint  Patient presents with   Room 6    Pt is here with interpreter. Pt states that her therapy on her right side has gotten better. Pt states that she has been having a hot pain in the middle of her lower back.     HISTORY OF PRESENT ILLNESS:   UPDATE (05/04/23, VRP): Since last visit, doing better with back and leg pain. Had some right sided facial twitching in August 2024; minor on left side. Now improved.  PRIOR HPI (01/02/23): 62 year old female here for evaluation of right leg pain.  Symptoms started around 2020 with pain radiating from her right lower back, right hip down her right leg.  Has some numbness, cool sensation in the right foot.  She saw orthopedic clinic, had MRI of the lumbar spine but results not available today.  She then saw neurology consult at [facility] and was recommended to pursue conservative management of right lumbar radiculopathy.  Outside notes mention that symptoms improved although patient denies that today.  Patient states that symptoms have been consistent since that time.  She also relates onset of symptoms to a time when she was cleaning a room and she banged her right thigh into a hard wooden bed frame.  Patient has history of connective tissue disorder, lupus, inflammatory arthritis, on treatment by rheumatology Dr. Deanne Coffer.  His notes mention consult to neurology for bilateral lower extremity and feet numbness or tingling although patient denies any problems in the left leg today.  No problems in fingers or hands.   REVIEW OF SYSTEMS: Full 14 system review of systems performed and negative with exception of: as per HPI.  ALLERGIES: Allergies  Allergen Reactions   Lidocaine Shortness Of Breath    Allergy actually to Septocaine,  not Lidocaine. Palpation and SOB   Other Anaphylaxis   Shrimp Extract Anaphylaxis   Iodinated Contrast Media Other (See Comments) and Rash   Shellfish Allergy     HOME MEDICATIONS: Outpatient Medications Prior to Visit  Medication Sig Dispense Refill   albuterol (VENTOLIN HFA) 108 (90 Base) MCG/ACT inhaler Inhale into the lungs.     alendronate (FOSAMAX) 70 MG tablet Take 70 mg by mouth once a week.     ALPRAZolam (XANAX) 0.5 MG tablet for sedation before MRI scan; take 1 tab 1 hour before scan; may repeat 1 tab 15 min before scan 3 tablet 0   benzonatate (TESSALON) 100 MG capsule Take 100 mg by mouth as needed for cough.     cetirizine (ZYRTEC ALLERGY) 10 MG tablet Take 1 tablet (10 mg total) by mouth daily. 30 tablet 0   fluticasone-salmeterol (ADVAIR) 250-50 MCG/ACT AEPB Inhale 1 puff into the lungs 2 (two) times daily.     gabapentin (NEURONTIN) 300 MG capsule Take 1 capsule (300 mg total) by mouth at bedtime. 15 capsule 0   hydroxychloroquine (PLAQUENIL) 200 MG tablet Take by mouth daily.     promethazine-dextromethorphan (PROMETHAZINE-DM) 6.25-15 MG/5ML syrup Take 5 mLs by mouth 3 (three) times daily as needed for cough. 200 mL 0   triamcinolone cream (KENALOG) 0.1 % Apply 1 Application topically 2 (two) times daily. 30 g 0   ciclopirox (PENLAC) 8 % solution Apply topically at bedtime. Apply over nail and surrounding skin. Apply daily over previous coat. After seven (7)  days, may remove with alcohol and continue cycle. (Patient not taking: Reported on 04/24/2023) 6.6 mL 3   hydrOXYzine (ATARAX/VISTARIL) 10 MG tablet Take 1 tablet (10 mg total) by mouth 3 (three) times daily as needed. (Patient not taking: Reported on 02/06/2023) 30 tablet 0   omeprazole (PRILOSEC) 40 MG capsule Take 40 mg by mouth daily. (Patient not taking: Reported on 02/06/2023)     predniSONE (DELTASONE) 50 MG tablet Take 1 tablet (50 mg total) by mouth daily with breakfast. (Patient not taking: Reported on 02/06/2023)  6 tablet 0   pseudoephedrine (SUDAFED) 30 MG tablet Take 1 tablet (30 mg total) by mouth every 8 (eight) hours as needed for congestion. (Patient not taking: Reported on 04/24/2023) 30 tablet 0   No facility-administered medications prior to visit.    PAST MEDICAL HISTORY: Past Medical History:  Diagnosis Date   Lupus    Raynaud's disease    Sjogren's disease (HCC)     PAST SURGICAL HISTORY: Past Surgical History:  Procedure Laterality Date   BREAST SURGERY     CESAREAN SECTION     TUBAL LIGATION      FAMILY HISTORY: Family History  Problem Relation Age of Onset   Diabetes Mother    Diabetes Father    Arthritis/Rheumatoid Father    Diabetes Brother    Heart attack Paternal Grandfather    Sleep apnea Neg Hx     SOCIAL HISTORY: Social History   Socioeconomic History   Marital status: Divorced    Spouse name: Not on file   Number of children: Not on file   Years of education: Not on file   Highest education level: Not on file  Occupational History   Occupation: Cleaning houses  Tobacco Use   Smoking status: Never   Smokeless tobacco: Never  Vaping Use   Vaping status: Never Used  Substance and Sexual Activity   Alcohol use: No   Drug use: No   Sexual activity: Not Currently    Comment: Has been Divorced for 12 years   Other Topics Concern   Not on file  Social History Narrative   Patient divorced 12 years. Patient had three children - two boys and 1 girl in there 14s- 30s. Eldest is  studying education in [place] - getting PHD.    Patient lives on her own in [place].    Social Determinants of Health   Financial Resource Strain: Not on file  Food Insecurity: Low Risk  (01/25/2023)   Received from [facility]   Hunger Vital Sign    Worried About Running Out of Food in the Last Year: Never true    Ran Out of Food in the Last Year: Never true  Transportation Needs: Not on file (01/25/2023)  Physical Activity: Not on file  Stress: Not on file  Social  Connections: Not on file  Intimate Partner Violence: Not on file     PHYSICAL EXAM  GENERAL EXAM/CONSTITUTIONAL: Vitals:  Vitals:   05/04/23 1047  Weight: 118 lb 3.2 oz (53.6 kg)  Height: 5\' 1"  (1.549 m)    Body mass index is 22.33 kg/m. Wt Readings from Last 3 Encounters:  05/04/23 118 lb 3.2 oz (53.6 kg)  04/24/23 117 lb 9.6 oz (53.3 kg)  04/05/23 119 lb (54 kg)   Patient is in no distress; well developed, nourished and groomed; neck is supple  CARDIOVASCULAR: Examination of carotid arteries is normal; no carotid bruits Regular rate and rhythm, no murmurs Examination of peripheral  vascular system by observation and palpation is normal  EYES: Ophthalmoscopic exam of optic discs and posterior segments is normal; no papilledema or hemorrhages No results found.  MUSCULOSKELETAL: Gait, strength, tone, movements noted in Neurologic exam below  NEUROLOGIC: MENTAL STATUS:      No data to display         awake, alert, oriented to person, place and time recent and remote memory intact normal attention and concentration language fluent, comprehension intact, naming intact fund of knowledge appropriate  CRANIAL NERVE:  2nd - no papilledema on fundoscopic exam 2nd, 3rd, 4th, 6th - pupils equal and reactive to light, visual fields full to confrontation, extraocular muscles intact, no nystagmus 5th - facial sensation symmetric 7th - facial strength symmetric 8th - hearing intact 9th - palate elevates symmetrically, uvula midline 11th - shoulder shrug symmetric 12th - tongue protrusion midline  MOTOR:  normal bulk and tone, full strength in the BUE, BLE  SENSORY:  normal and symmetric to light touch, temperature, vibration  COORDINATION:  finger-nose-finger, fine finger movements normal  REFLEXES:  deep tendon reflexes 1+ and symmetric  GAIT/STATION:  narrow based gait     DIAGNOSTIC DATA (LABS, IMAGING, TESTING) - I reviewed patient records, labs,  notes, testing and imaging myself where available.  Lab Results  Component Value Date   WBC 4.6 03/09/2023   HGB 13.6 03/09/2023   HCT 42.4 03/09/2023   MCV 84.0 03/09/2023   PLT 194.0 03/09/2023      Component Value Date/Time   NA 140 03/09/2023 1338   NA 147 (H) 11/02/2017 1554   K 3.7 03/09/2023 1338   CL 104 03/09/2023 1338   CO2 27 03/09/2023 1338   GLUCOSE 88 03/09/2023 1338   BUN 17 03/09/2023 1338   BUN 12 11/02/2017 1554   CREATININE 0.75 03/09/2023 1338   CALCIUM 9.5 03/09/2023 1338   PROT 7.3 03/09/2023 1338   PROT 7.7 05/09/2017 1027   ALBUMIN 4.4 03/09/2023 1338   ALBUMIN 4.7 05/09/2017 1027   AST 20 03/09/2023 1338   ALT 19 03/09/2023 1338   ALKPHOS 112 03/09/2023 1338   BILITOT 0.3 03/09/2023 1338   BILITOT 0.3 05/09/2017 1027   GFRNONAA >60 08/25/2021 2135   GFRAA 112 11/02/2017 1554   Lab Results  Component Value Date   CHOL 177 09/18/2022   HDL 48.20 09/18/2022   LDLCALC 102 (H) 09/18/2022   TRIG 137.0 09/18/2022   CHOLHDL 4 09/18/2022   Lab Results  Component Value Date   HGBA1C 5.1 09/18/2022   Lab Results  Component Value Date   VITAMINB12 828 03/09/2023   Lab Results  Component Value Date   TSH 0.80 03/09/2023    04/24/19 xray lumbar spine - Mild degenerative changes of the lumbar spine without acute osseous abnormality.  02/05/23 MRI of the lumbar spine without contrast shows the following: At L3-L4, degenerative changes cause borderline spinal stenosis and minimal lateral recess stenosis but no nerve root compression. At L4-L5, central disc protrusion and other degenerative changes cause is borderline spinal stenosis mild bilateral foraminal narrowing and moderate left greater than right lateral recess stenosis but no nerve root compression. At L5-S1, a small central disc extrusion and other degenerative changes cause moderate spinal stenosis, mild foraminal narrowing and moderate right greater than left lateral recess stenosis  though there is no definite nerve root compression.   ASSESSMENT AND PLAN  62 y.o. year old female here with:   Dx:  1. Right lumbar radiculopathy   2.  Facial spasm      PLAN:  INTERMITTENT RIGHT > LEFT FACIAL SPASMS - monitor for now; consider MRI brain if it returns  DEGENERATIVE LUMBAR SPINE DISEASE --> RIGHT LOW BACK PAIN --> radiating to the right leg and foot (since ~2020) - continue PT evaluation / exercises - consider pain mgmt clinic, spine clinic eval if symptoms worsen  Return for return to PCP, pending if symptoms worsen or fail to improve.    Suanne Marker, MD 05/04/2023, 11:17 AM Certified in Neurology, Neurophysiology and Neuroimaging  [facility] [address] [place], [place] [address] [phone]

## 2023-05-10 NOTE — Telephone Encounter (Signed)
[facility] a response to referral for pain clinic: We are writing in response to the referral of Cheryl White. After careful review of the provided documentation, we regret to inform you that we are unable to process the referral at this time due to the following reasons: Patient did not respond.

## 2023-05-16 ENCOUNTER — Encounter: Payer: Self-pay | Admitting: Emergency Medicine

## 2023-05-16 ENCOUNTER — Ambulatory Visit: Payer: 59 | Admitting: Neurology

## 2023-05-16 DIAGNOSIS — R351 Nocturia: Secondary | ICD-10-CM

## 2023-05-16 DIAGNOSIS — G4733 Obstructive sleep apnea (adult) (pediatric): Secondary | ICD-10-CM

## 2023-05-16 DIAGNOSIS — R519 Headache, unspecified: Secondary | ICD-10-CM

## 2023-05-16 DIAGNOSIS — IMO0002 Reserved for concepts with insufficient information to code with codable children: Secondary | ICD-10-CM

## 2023-05-16 DIAGNOSIS — R0683 Snoring: Secondary | ICD-10-CM

## 2023-05-16 DIAGNOSIS — R002 Palpitations: Secondary | ICD-10-CM

## 2023-05-16 DIAGNOSIS — Z9189 Other specified personal risk factors, not elsewhere classified: Secondary | ICD-10-CM

## 2023-05-17 ENCOUNTER — Ambulatory Visit
Admission: RE | Admit: 2023-05-17 | Discharge: 2023-05-17 | Disposition: A | Discharge status: Home / Self Care | Payer: 59 | Source: Ambulatory Visit | Attending: Emergency Medicine | Admitting: Emergency Medicine

## 2023-05-17 DIAGNOSIS — Z1231 Encounter for screening mammogram for malignant neoplasm of breast: Secondary | ICD-10-CM | POA: Diagnosis not present

## 2023-05-17 NOTE — Progress Notes (Signed)
See procedure note.

## 2023-05-21 NOTE — Procedures (Signed)
[facility]  HOME SLEEP TEST (Watch PAT) REPORT  STUDY DATE: 05/16/2023  DOB: [birth year]  MRN: [id number]  ORDERING CLINICIAN: Huston Foley, MD, PhD   REFERRING CLINICIAN: Georgina Quint, MD   CLINICAL INFORMATION/HISTORY: 62 year old female with an underlying medical history of lupus, Raynaud's disease, Sjogren's disease, anxiety, lumbar radiculopathy (for which she has seen my colleague, Dr. Marjory Lies recently), who reports snoring and waking up with headaches as well as palpitations.    Epworth sleepiness score: 1/24.  BMI: 22.5 kg/m  FINDINGS:   Sleep Summary:   Total Recording Time (hours, min): 9 hours, 13 min  Total Sleep Time (hours, min):  3 hours, 48 min  Percent REM (%):    5.5%   Respiratory Indices:   Calculated pAHI (per hour):  10.2/hour         REM pAHI:    N/A       NREM pAHI: 10.2/hour  Central pAHI: 1.9/hour  Oxygen Saturation Statistics:    Oxygen Saturation (%) Mean: 95%   Minimum oxygen saturation (%):                 83%   O2 Saturation Range (%): 83 - 99%    O2 Saturation (minutes) <=88%: 0.1 min  Pulse Rate Statistics:   Pulse Mean (bpm):    56/min    Pulse Range (45 - 98/min)   IMPRESSION: OSA (obstructive sleep apnea), mild  RECOMMENDATION:  This home sleep test demonstrates overall mild obstructive sleep apnea with a total AHI of 10.2/hour and O2 nadir of 83%. Snoring was detected, and was variable, mostly in the mild to moderate range.  Sleep consolidation appeared to be poor during this test and limited sleep of less than 4 hours was detected. Given the patient's medical history and sleep related complaints, therapy with a positive airway pressure device can be considered for treatment of mild obstructive sleep apnea. Treatment can be achieved in the form of autoPAP trial/titration at home for now. A full night, in-lab PAP titration study may aid in improving proper treatment settings and with mask  fit, if needed, down the road. Alternative treatments may include weight loss (only where appropriate, this patient's BMI is less than 23), along with avoidance of the supine sleep position (if possible), or an oral appliance in appropriate candidates.   Patients with significant obstructive sleep apnea should receive perioperative PAP therapy and the surgeons and particularly the anesthesiologist should be informed of the diagnosis and the severity of the sleep disordered breathing. The patient should be cautioned not to drive, work at heights, or operate dangerous or heavy equipment when tired or sleepy. Review and reiteration of good sleep hygiene measures should be pursued with any patient. Other causes of the patient's symptoms, including circadian rhythm disturbances, an underlying mood disorder, medication effect and/or an underlying medical problem cannot be ruled out based on this test. Clinical correlation is recommended.  The patient and her referring provider will be notified of the test results. The patient will be seen in follow up in sleep clinic at [facility], as necessary.  I certify that I have reviewed the raw data recording prior to the issuance of this report in accordance with the standards of the American Academy of Sleep Medicine (AASM).    INTERPRETING PHYSICIAN:   Huston Foley, MD, PhD Medical Director, [facility]) Diplomat, ABPN (Neurology and Sleep)   [facility] [address] [place], [place] [address] [phone]  [phone]               

## 2023-05-23 ENCOUNTER — Telehealth: Payer: Self-pay | Admitting: *Deleted

## 2023-05-23 NOTE — Telephone Encounter (Signed)
Called pt using [employer] Elease Hashimoto [phone]) and LVM (ok per [facility]) asking for call back to discuss her sleep study results and the treatment options.

## 2023-05-23 NOTE — Telephone Encounter (Signed)
-----   Message from Huston Foley sent at 05/21/2023  4:45 PM EST ----- Interpreter needed.  Patient referred by PCP, seen by me on 04/05/2023, patient had HST on 05/16/2023.    Please call and notify the patient that the recent home sleep test showed overall mild obstructive sleep apnea, and we can consider treatment in the form of an AutoPap machine as discussed during our appointment.  If she would like to trial a machine at home, I would be happy to prescribe it and she would get the machine through a local DME company.  Alternative treatment options can be in the form of a dental device.  If she would rather consult with a dentist for an oral appliance, we can certainly facilitate with a referral.  Her home sleep test not show a lot of sleep, overall less than 4 hours of sleep was detected.  Let me know how she would like to proceed.  Medically speaking mild sleep apnea does not have to be treated but we often offer treatment to see if symptoms such as snoring and sleep disruption may improve.  Thanks,   Huston Foley, MD, PhD [facility])

## 2023-05-29 ENCOUNTER — Telehealth: Payer: Self-pay | Admitting: *Deleted

## 2023-05-29 NOTE — Telephone Encounter (Signed)
I spoke to daughter and relayed the results of the HST.  Relayed the options APAP, dental device or nothing.  She will speak to her mother an get back to [place].  She verbalized understanding.

## 2023-05-29 NOTE — Telephone Encounter (Signed)
-----   Message from Huston Foley sent at 05/21/2023  4:45 PM EST ----- Interpreter needed.  Patient referred by PCP, seen by me on 04/05/2023, patient had HST on 05/16/2023.    Please call and notify the patient that the recent home sleep test showed overall mild obstructive sleep apnea, and we can consider treatment in the form of an AutoPap machine as discussed during our appointment.  If she would like to trial a machine at home, I would be happy to prescribe it and she would get the machine through a local DME company.  Alternative treatment options can be in the form of a dental device.  If she would rather consult with a dentist for an oral appliance, we can certainly facilitate with a referral.  Her home sleep test not show a lot of sleep, overall less than 4 hours of sleep was detected.  Let me know how she would like to proceed.  Medically speaking mild sleep apnea does not have to be treated but we often offer treatment to see if symptoms such as snoring and sleep disruption may improve.  Thanks,   Huston Foley, MD, PhD [facility])

## 2023-05-29 NOTE — Telephone Encounter (Signed)
Pt's daughter returning call regarding sleep study results. Would like a call back.

## 2023-05-29 NOTE — Telephone Encounter (Signed)
I called pts daughter and resulted results.  See other note.

## 2023-05-29 NOTE — Telephone Encounter (Signed)
I called pt again using [employer] Electa Sniff [phone]). We LVM asking for call back to discuss her sleep study results and next steps.

## 2023-06-15 DIAGNOSIS — Z23 Encounter for immunization: Secondary | ICD-10-CM | POA: Diagnosis not present

## 2023-06-15 DIAGNOSIS — B351 Tinea unguium: Secondary | ICD-10-CM | POA: Diagnosis not present

## 2023-06-15 DIAGNOSIS — M329 Systemic lupus erythematosus, unspecified: Secondary | ICD-10-CM | POA: Diagnosis not present

## 2023-06-15 DIAGNOSIS — Z0000 Encounter for general adult medical examination without abnormal findings: Secondary | ICD-10-CM | POA: Diagnosis not present

## 2023-06-15 DIAGNOSIS — Z136 Encounter for screening for cardiovascular disorders: Secondary | ICD-10-CM | POA: Diagnosis not present

## 2023-06-15 DIAGNOSIS — L309 Dermatitis, unspecified: Secondary | ICD-10-CM | POA: Diagnosis not present

## 2023-06-15 DIAGNOSIS — M810 Age-related osteoporosis without current pathological fracture: Secondary | ICD-10-CM | POA: Diagnosis not present

## 2023-06-22 DIAGNOSIS — K2900 Acute gastritis without bleeding: Secondary | ICD-10-CM | POA: Diagnosis not present

## 2023-06-25 DIAGNOSIS — Z79899 Other long term (current) drug therapy: Secondary | ICD-10-CM | POA: Diagnosis not present

## 2023-06-25 DIAGNOSIS — H16223 Keratoconjunctivitis sicca, not specified as Sjogren's, bilateral: Secondary | ICD-10-CM | POA: Diagnosis not present

## 2023-06-25 DIAGNOSIS — H16103 Unspecified superficial keratitis, bilateral: Secondary | ICD-10-CM | POA: Diagnosis not present

## 2023-06-25 DIAGNOSIS — H2513 Age-related nuclear cataract, bilateral: Secondary | ICD-10-CM | POA: Diagnosis not present

## 2023-06-29 DIAGNOSIS — R1084 Generalized abdominal pain: Secondary | ICD-10-CM | POA: Diagnosis not present

## 2023-06-29 DIAGNOSIS — B351 Tinea unguium: Secondary | ICD-10-CM | POA: Diagnosis not present

## 2023-07-05 DIAGNOSIS — J069 Acute upper respiratory infection, unspecified: Secondary | ICD-10-CM | POA: Diagnosis not present

## 2023-07-10 DIAGNOSIS — J0110 Acute frontal sinusitis, unspecified: Secondary | ICD-10-CM | POA: Diagnosis not present

## 2023-07-10 DIAGNOSIS — H6691 Otitis media, unspecified, right ear: Secondary | ICD-10-CM | POA: Diagnosis not present

## 2023-07-17 DIAGNOSIS — M329 Systemic lupus erythematosus, unspecified: Secondary | ICD-10-CM | POA: Diagnosis not present

## 2023-07-17 DIAGNOSIS — M810 Age-related osteoporosis without current pathological fracture: Secondary | ICD-10-CM | POA: Diagnosis not present

## 2023-07-17 DIAGNOSIS — M1990 Unspecified osteoarthritis, unspecified site: Secondary | ICD-10-CM | POA: Diagnosis not present

## 2023-07-17 DIAGNOSIS — I7300 Raynaud's syndrome without gangrene: Secondary | ICD-10-CM | POA: Diagnosis not present

## 2023-07-17 DIAGNOSIS — M79643 Pain in unspecified hand: Secondary | ICD-10-CM | POA: Diagnosis not present

## 2023-07-27 DIAGNOSIS — L282 Other prurigo: Secondary | ICD-10-CM | POA: Diagnosis not present

## 2023-07-27 DIAGNOSIS — B351 Tinea unguium: Secondary | ICD-10-CM | POA: Diagnosis not present

## 2023-08-02 DIAGNOSIS — K219 Gastro-esophageal reflux disease without esophagitis: Secondary | ICD-10-CM | POA: Diagnosis not present

## 2023-08-02 DIAGNOSIS — R053 Chronic cough: Secondary | ICD-10-CM | POA: Diagnosis not present

## 2023-08-07 DIAGNOSIS — R918 Other nonspecific abnormal finding of lung field: Secondary | ICD-10-CM | POA: Diagnosis not present

## 2023-08-07 DIAGNOSIS — J984 Other disorders of lung: Secondary | ICD-10-CM | POA: Diagnosis not present

## 2023-08-07 DIAGNOSIS — R911 Solitary pulmonary nodule: Secondary | ICD-10-CM | POA: Diagnosis not present

## 2023-08-07 DIAGNOSIS — R9389 Abnormal findings on diagnostic imaging of other specified body structures: Secondary | ICD-10-CM | POA: Diagnosis not present

## 2023-08-24 DIAGNOSIS — R748 Abnormal levels of other serum enzymes: Secondary | ICD-10-CM | POA: Diagnosis not present

## 2023-09-05 DIAGNOSIS — R748 Abnormal levels of other serum enzymes: Secondary | ICD-10-CM | POA: Diagnosis not present

## 2023-09-14 DIAGNOSIS — H16223 Keratoconjunctivitis sicca, not specified as Sjogren's, bilateral: Secondary | ICD-10-CM | POA: Diagnosis not present

## 2023-09-14 DIAGNOSIS — H16103 Unspecified superficial keratitis, bilateral: Secondary | ICD-10-CM | POA: Diagnosis not present

## 2023-09-14 DIAGNOSIS — H2513 Age-related nuclear cataract, bilateral: Secondary | ICD-10-CM | POA: Diagnosis not present

## 2023-09-24 DIAGNOSIS — U071 COVID-19: Secondary | ICD-10-CM | POA: Diagnosis not present

## 2023-09-24 DIAGNOSIS — R058 Other specified cough: Secondary | ICD-10-CM | POA: Diagnosis not present

## 2023-11-14 DIAGNOSIS — I7300 Raynaud's syndrome without gangrene: Secondary | ICD-10-CM | POA: Diagnosis not present

## 2023-11-14 DIAGNOSIS — B351 Tinea unguium: Secondary | ICD-10-CM | POA: Diagnosis not present

## 2023-11-14 DIAGNOSIS — B353 Tinea pedis: Secondary | ICD-10-CM | POA: Diagnosis not present

## 2023-11-14 DIAGNOSIS — Z79899 Other long term (current) drug therapy: Secondary | ICD-10-CM | POA: Diagnosis not present

## 2023-11-14 DIAGNOSIS — M79643 Pain in unspecified hand: Secondary | ICD-10-CM | POA: Diagnosis not present

## 2023-11-14 DIAGNOSIS — M329 Systemic lupus erythematosus, unspecified: Secondary | ICD-10-CM | POA: Diagnosis not present

## 2023-11-14 DIAGNOSIS — L308 Other specified dermatitis: Secondary | ICD-10-CM | POA: Diagnosis not present

## 2023-11-15 DIAGNOSIS — M47816 Spondylosis without myelopathy or radiculopathy, lumbar region: Secondary | ICD-10-CM | POA: Diagnosis not present

## 2023-11-15 DIAGNOSIS — M419 Scoliosis, unspecified: Secondary | ICD-10-CM | POA: Diagnosis not present

## 2023-11-15 DIAGNOSIS — M47812 Spondylosis without myelopathy or radiculopathy, cervical region: Secondary | ICD-10-CM | POA: Diagnosis not present

## 2023-11-15 DIAGNOSIS — M546 Pain in thoracic spine: Secondary | ICD-10-CM | POA: Diagnosis not present

## 2023-11-15 DIAGNOSIS — M542 Cervicalgia: Secondary | ICD-10-CM | POA: Diagnosis not present

## 2023-11-15 DIAGNOSIS — M549 Dorsalgia, unspecified: Secondary | ICD-10-CM | POA: Diagnosis not present

## 2023-11-15 DIAGNOSIS — M545 Low back pain, unspecified: Secondary | ICD-10-CM | POA: Diagnosis not present

## 2023-11-23 DIAGNOSIS — L308 Other specified dermatitis: Secondary | ICD-10-CM | POA: Diagnosis not present

## 2023-11-23 DIAGNOSIS — M5030 Other cervical disc degeneration, unspecified cervical region: Secondary | ICD-10-CM | POA: Diagnosis not present

## 2023-11-30 DIAGNOSIS — E782 Mixed hyperlipidemia: Secondary | ICD-10-CM | POA: Diagnosis not present

## 2023-11-30 DIAGNOSIS — R1013 Epigastric pain: Secondary | ICD-10-CM | POA: Diagnosis not present

## 2023-11-30 DIAGNOSIS — R079 Chest pain, unspecified: Secondary | ICD-10-CM | POA: Diagnosis not present

## 2023-12-04 ENCOUNTER — Other Ambulatory Visit (HOSPITAL_BASED_OUTPATIENT_CLINIC_OR_DEPARTMENT_OTHER): Payer: Self-pay | Admitting: Family Medicine

## 2023-12-04 DIAGNOSIS — E782 Mixed hyperlipidemia: Secondary | ICD-10-CM

## 2023-12-23 ENCOUNTER — Emergency Department (HOSPITAL_BASED_OUTPATIENT_CLINIC_OR_DEPARTMENT_OTHER)
Admission: EM | Admit: 2023-12-23 | Discharge: 2023-12-23 | Disposition: A | Discharge status: Home / Self Care | Attending: Emergency Medicine | Admitting: Emergency Medicine

## 2023-12-23 ENCOUNTER — Other Ambulatory Visit: Payer: Self-pay

## 2023-12-23 ENCOUNTER — Encounter (HOSPITAL_BASED_OUTPATIENT_CLINIC_OR_DEPARTMENT_OTHER): Payer: Self-pay | Admitting: Emergency Medicine

## 2023-12-23 ENCOUNTER — Emergency Department (HOSPITAL_BASED_OUTPATIENT_CLINIC_OR_DEPARTMENT_OTHER)

## 2023-12-23 DIAGNOSIS — Z79899 Other long term (current) drug therapy: Secondary | ICD-10-CM | POA: Insufficient documentation

## 2023-12-23 DIAGNOSIS — R079 Chest pain, unspecified: Secondary | ICD-10-CM | POA: Diagnosis not present

## 2023-12-23 DIAGNOSIS — R072 Precordial pain: Secondary | ICD-10-CM | POA: Insufficient documentation

## 2023-12-23 LAB — CBC
HCT: 43.2 % (ref 36.0–46.0)
Hemoglobin: 14 g/dL (ref 12.0–15.0)
MCH: 27.1 pg (ref 26.0–34.0)
MCHC: 32.4 g/dL (ref 30.0–36.0)
MCV: 83.6 fL (ref 80.0–100.0)
Platelets: 184 10*3/uL (ref 150–400)
RBC: 5.17 MIL/uL — ABNORMAL HIGH (ref 3.87–5.11)
RDW: 12.7 % (ref 11.5–15.5)
WBC: 4.6 10*3/uL (ref 4.0–10.5)
nRBC: 0 % (ref 0.0–0.2)

## 2023-12-23 LAB — TROPONIN T, HIGH SENSITIVITY
Troponin T High Sensitivity: <15 ng/L (ref <19)
Troponin T High Sensitivity: <15 ng/L (ref <19)

## 2023-12-23 LAB — HEPATIC FUNCTION PANEL
ALT: 17 U/L (ref 0–44)
AST: 25 U/L (ref 15–41)
Albumin: 4.8 g/dL (ref 3.5–5.0)
Alkaline Phosphatase: 124 U/L (ref 38–126)
Bilirubin, Direct: 0.2 mg/dL (ref 0.0–0.2)
Indirect Bilirubin: 0.3 mg/dL (ref 0.3–0.9)
Total Bilirubin: 0.4 mg/dL (ref 0.0–1.2)
Total Protein: 7.8 g/dL (ref 6.5–8.1)

## 2023-12-23 LAB — BASIC METABOLIC PANEL WITH GFR
Anion gap: 12 (ref 5–15)
BUN: 12 mg/dL (ref 8–23)
CO2: 26 mmol/L (ref 22–32)
Calcium: 9.7 mg/dL (ref 8.9–10.3)
Chloride: 103 mmol/L (ref 98–111)
Creatinine, Ser: 0.7 mg/dL (ref 0.44–1.00)
GFR, Estimated: >60 mL/min (ref >60)
Glucose, Bld: 90 mg/dL (ref 70–99)
Potassium: 4.1 mmol/L (ref 3.5–5.1)
Sodium: 142 mmol/L (ref 135–145)

## 2023-12-23 LAB — LIPASE, BLOOD: Lipase: 45 U/L (ref 11–51)

## 2023-12-23 MED ORDER — SUCRALFATE 1 G PO TABS: 1.0000 g | ORAL_TABLET | Freq: Three times a day (TID) | ORAL | 0 refills | Status: AC

## 2023-12-23 MED ORDER — FAMOTIDINE 20 MG PO TABS
20.0000 mg | ORAL_TABLET | Freq: Two times a day (BID) | ORAL | 0 refills | Status: AC
Start: 2023-12-23 — End: ?

## 2023-12-23 MED ORDER — PANTOPRAZOLE SODIUM 20 MG PO TBEC: 20.0000 mg | DELAYED_RELEASE_TABLET | Freq: Every day | ORAL | 0 refills | Status: AC

## 2023-12-23 NOTE — Discharge Instructions (Signed)
 Please read and follow all provided instructions.  Your diagnoses today include:  1. Precordial pain     Tests performed today include: An EKG of your heart: No problems or changes A chest x-ray: Chest x-ray was clear Cardiac enzymes - a blood test for heart muscle damage, were both normal Blood counts and electrolytes Vital signs. See below for your results today.   Medications prescribed:  Pantoprazole  - stomach acid reducer (Take this medication everyday)  Pepcid (famotidine) -stomach acid reducer (Take this medication twice a day for one week)  Carafate - for stomach upset and to protect your stomach (Take with meals and at bedtime)  Take any prescribed medications only as directed.  Follow-up instructions: Please follow-up with your primary care provider as soon as you can for further evaluation of your symptoms.   Return instructions:  SEEK IMMEDIATE MEDICAL ATTENTION IF: You have severe chest pain, especially if the pain is crushing or pressure-like and spreads to the arms, back, neck, or jaw, or if you have sweating, nausea or vomiting, or trouble with breathing. THIS IS AN EMERGENCY. Do not wait to see if the pain will go away. Get medical help at once. Call 911. DO NOT drive yourself to the hospital.  Your chest pain gets worse and does not go away after a few minutes of rest.  You have an attack of chest pain lasting longer than what you usually experience.  You have significant dizziness, if you pass out, or have trouble walking.  You have chest pain not typical of your usual pain for which you originally saw your caregiver.  You have any other emergent concerns regarding your health.  Additional Information: Chest pain comes from many different causes. Your caregiver has diagnosed you as having chest pain that is not specific for one problem, but does not require admission.  You are at low risk for an acute heart condition or other serious illness.   Your vital signs  today were: BP 106/65   Pulse (!) 54   Temp 98.1 F (36.7 C)   Resp 14   Wt 54.4 kg   SpO2 99%   BMI 22.67 kg/m  If your blood pressure (BP) was elevated above 135/85 this visit, please have this repeated by your doctor within one month. --------------

## 2023-12-23 NOTE — ED Provider Notes (Signed)
 [facility] Provider Note   CSN: [id number] Arrival date & time: 12/23/23  1154     Patient presents with: Chest Pain   Cheryl White is a 63 y.o. female.   Patient with history of cholesterol (not yet on medication for this), GERD -- presents to the emergency department for evaluation of chest pain and pressure that started around 10 AM this morning.  Patient was driving when the symptoms occur.  She did not have associated vomiting.  She did sweat a bit.  Pain radiated to her back.  No radiation of pain to the neck or jaw, shoulders or arms.  Pain has gradually decreased and is minimally present currently.  No lower extremity swelling or history of blood clots.  No smoking history.  No abdominal tenderness or pain.  Last no strokelike symptoms including vision changes or vision loss, unilateral weakness, numbness in the hands or feet.  States that PCP recently wanted her to follow-up with cardiology as she was having some vague chest symptoms and high cholesterol.       Prior to Admission medications   Medication Sig Start Date End Date Taking? Authorizing Provider  albuterol  (VENTOLIN  HFA) 108 (90 Base) MCG/ACT inhaler Inhale into the lungs. 09/29/22   [provider]  alendronate (FOSAMAX) 70 MG tablet Take 70 mg by mouth once a week.    [provider]  ALPRAZolam  (XANAX ) 0.5 MG tablet for sedation before MRI scan; take 1 tab 1 hour before scan; may repeat 1 tab 15 min before scan 01/09/23   Penumalli, Vikram R, MD  benzonatate (TESSALON) 100 MG capsule Take 100 mg by mouth as needed for cough.    [provider]  cetirizine  (ZYRTEC  ALLERGY) 10 MG tablet Take 1 tablet (10 mg total) by mouth daily. 03/11/23   Adolph Hoop, PA-C  ciclopirox  (PENLAC ) 8 % solution Apply topically at bedtime. Apply over nail and surrounding skin. Apply daily over previous coat. After seven (7) days, may remove with alcohol and continue  cycle. Patient not taking: Reported on 04/24/2023 02/06/23   Sagardia, Miguel Jose, MD  fluticasone -salmeterol (ADVAIR) 250-50 MCG/ACT AEPB Inhale 1 puff into the lungs 2 (two) times daily. 09/29/22   [provider]  gabapentin  (NEURONTIN ) 300 MG capsule Take 1 capsule (300 mg total) by mouth at bedtime. 01/27/23   Crain, Whitney L, PA  hydroxychloroquine (PLAQUENIL) 200 MG tablet Take by mouth daily.    [provider]  hydrOXYzine  (ATARAX /VISTARIL ) 10 MG tablet Take 1 tablet (10 mg total) by mouth 3 (three) times daily as needed. Patient not taking: Reported on 02/06/2023 07/18/17   Mikell, [name] Zahra, MD  omeprazole (PRILOSEC) 40 MG capsule Take 40 mg by mouth daily. Patient not taking: Reported on 02/06/2023 09/29/22   [provider]  predniSONE  (DELTASONE ) 50 MG tablet Take 1 tablet (50 mg total) by mouth daily with breakfast. Patient not taking: Reported on 02/06/2023 01/27/23   Crain, Whitney L, PA  promethazine -dextromethorphan (PROMETHAZINE -DM) 6.25-15 MG/5ML syrup Take 5 mLs by mouth 3 (three) times daily as needed for cough. 03/11/23   Adolph Hoop, PA-C  pseudoephedrine  (SUDAFED) 30 MG tablet Take 1 tablet (30 mg total) by mouth every 8 (eight) hours as needed for congestion. Patient not taking: Reported on 04/24/2023 03/11/23   Adolph Hoop, PA-C  triamcinolone  cream (KENALOG ) 0.1 % Apply 1 Application topically 2 (two) times daily. 04/24/23   Elvira Hammersmith, MD    Allergies: Lidocaine,  Other, Shrimp extract, Iodinated contrast media, and Shellfish allergy    Review of Systems  Updated Vital Signs BP 106/88 (BP Location: Left Arm)   Pulse (!) 57   Temp 98.1 F (36.7 C)   Resp 17   Wt 54.4 kg   SpO2 99%   BMI 22.67 kg/m   Physical Exam Vitals and nursing note reviewed.  Constitutional:      Appearance: She is well-developed. She is not diaphoretic.  HENT:     Head: Normocephalic and atraumatic.     Mouth/Throat:     Mouth: Mucous membranes are  not dry.   Eyes:     Conjunctiva/sclera: Conjunctivae normal.   Neck:     Vascular: Normal carotid pulses. No JVD.     Trachea: Trachea normal. No tracheal deviation.   Cardiovascular:     Rate and Rhythm: Normal rate and regular rhythm.     Pulses: No decreased pulses.          Radial pulses are 2+ on the right side and 2+ on the left side.     Heart sounds: Normal heart sounds, S1 normal and S2 normal. No murmur heard. Pulmonary:     Effort: Pulmonary effort is normal. No respiratory distress.     Breath sounds: No wheezing.  Chest:     Chest wall: No tenderness.  Abdominal:     General: Bowel sounds are normal.     Palpations: Abdomen is soft.     Tenderness: There is no abdominal tenderness. There is no guarding or rebound.   Musculoskeletal:        General: Normal range of motion.     Cervical back: Normal range of motion and neck supple. No muscular tenderness.   Skin:    General: Skin is warm and dry.     Coloration: Skin is not pale.   Neurological:     Mental Status: She is alert.     ED Course  Patient seen and examined. History obtained directly from patient.   Labs/EKG: Ordered CBC, BMP, troponin.  I have added on hepatic function.  Imaging: Ordered chest x-ray.  Medications/Fluids: None ordered  Most recent vital signs reviewed and are as follows: BP 106/88 (BP Location: Left Arm)   Pulse (!) 57   Temp 98.1 F (36.7 C)   Resp 17   Wt 54.4 kg   SpO2 99%   BMI 22.67 kg/m   Initial impression: Atypical chest pain, possibly GI in etiology given history  3:35 PM Reassessment performed. Patient appears stable.  Pain resolved.  Labs personally reviewed and interpreted including: CBC unremarkable; BMP normal; hepatic function panel negative; lipase normal; troponin less than 15 x 2  Imaging personally visualized and interpreted including: Chest x-ray negative  Reviewed pertinent lab work and imaging with patient at bedside. Questions answered.    Most current vital signs reviewed and are as follows: BP 106/65   Pulse (!) 54   Temp 98.1 F (36.7 C)   Resp 14   Wt 54.4 kg   SpO2 99%   BMI 22.67 kg/m   Plan: Discharge to home.   Prescriptions written for: Protonix , Pepcid, Carafate trial  Return and follow-up instructions: I encouraged patient to return to ED with severe chest pain, especially if the pain is crushing or pressure-like and spreads to the arms, back, neck, or jaw, or if they have associated sweating, vomiting, or shortness of breath with the pain, or significant pain with activity. We discussed that  the evaluation here today indicates a low-risk of serious cause of chest pain, including heart trouble or a blood clot, but no evaluation is perfect and chest pain can evolve with time. The patient verbalized understanding and agreed.  I encouraged patient to follow-up with their provider in the next 1 week for recheck.     (all labs ordered are listed, but only abnormal results are displayed) Labs Reviewed  CBC - Abnormal; Notable for the following components:      Result Value   RBC 5.17 (*)    All other components within normal limits  BASIC METABOLIC PANEL WITH GFR  HEPATIC FUNCTION PANEL  LIPASE, BLOOD  TROPONIN T, HIGH SENSITIVITY  TROPONIN T, HIGH SENSITIVITY    ED ECG REPORT   Date: 12/23/2023  Rate: 62  Rhythm: normal sinus rhythm  QRS Axis: normal  Intervals: normal  ST/T Wave abnormalities: normal  Conduction Disutrbances:none  Narrative Interpretation:   Old EKG Reviewed: unchanged  I have personally reviewed the EKG tracing and agree with the computerized printout as noted.   Radiology: DG Chest 2 View Result Date: 12/23/2023 CLINICAL DATA:  chest pain EXAM: CHEST - 2 VIEW COMPARISON:  April 12, 2022 FINDINGS: The cardiomediastinal silhouette is normal in contour. No pleural effusion. No pneumothorax. No acute pleuroparenchymal abnormality. Visualized abdomen is unremarkable. Mild  degenerative changes at the thoracolumbar junction. IMPRESSION: No acute cardiopulmonary abnormality. Electronically Signed   By: Clancy Crimes M.D.   On: 12/23/2023 12:37     Procedures   Medications Ordered in the ED - No data to display                                  Medical Decision Making Amount and/or Complexity of Data Reviewed Labs: ordered. Radiology: ordered.  For this patient's complaint of chest pain, the following emergent conditions were considered on the differential diagnosis: acute coronary syndrome, pulmonary embolism, pneumothorax, myocarditis, pericardial tamponade, aortic dissection, thoracic aortic aneurysm complication, esophageal perforation.   Other causes were also considered including: gastroesophageal reflux disease, musculoskeletal pain including costochondritis, pneumonia/pleurisy, herpes zoster, pericarditis.  In regards to possibility of ACS, patient has atypical features of pain, non-ischemic and unchanged EKG and negative troponin(s). Heart score was calculated to be 2.   In regards to possibility of PE, symptoms are atypical for PE and risk profile is low, making PE low likelihood.  No clinical signs or symptoms of DVT.  The patient's vital signs, pertinent lab work and imaging were reviewed and interpreted as discussed in the ED course. Hospitalization was considered for further testing, treatments, or serial exams/observation. However as patient is well-appearing, has a stable exam, and reassuring studies today, I do not feel that they warrant admission at this time. This plan was discussed with the patient who verbalizes agreement and comfort with this plan and seems reliable and able to return to the Emergency Department with worsening or changing symptoms.        Final diagnoses:  Precordial pain    ED Discharge Orders          Ordered    famotidine (PEPCID) 20 MG tablet  2 times daily        12/23/23 1531    sucralfate (CARAFATE)  1 g tablet  3 times daily with meals & bedtime        12/23/23 1531    pantoprazole  (PROTONIX ) 20 MG tablet  Daily  12/23/23 1535               Lyna Sandhoff, PA-C 12/23/23 1537    Mozell Arias, MD 12/24/23 [phone]

## 2023-12-23 NOTE — ED Triage Notes (Signed)
 Mid chest pressure and shortness of breath and mid back pain started 2 hours ago , fatigued , Hx HTN . Denies NV .

## 2023-12-27 DIAGNOSIS — L299 Pruritus, unspecified: Secondary | ICD-10-CM | POA: Diagnosis not present

## 2023-12-27 DIAGNOSIS — L209 Atopic dermatitis, unspecified: Secondary | ICD-10-CM | POA: Diagnosis not present

## 2024-02-13 DIAGNOSIS — M5030 Other cervical disc degeneration, unspecified cervical region: Secondary | ICD-10-CM | POA: Diagnosis not present

## 2024-02-13 DIAGNOSIS — R5383 Other fatigue: Secondary | ICD-10-CM | POA: Diagnosis not present

## 2024-02-13 DIAGNOSIS — R0609 Other forms of dyspnea: Secondary | ICD-10-CM | POA: Diagnosis not present

## 2024-02-22 ENCOUNTER — Other Ambulatory Visit (HOSPITAL_COMMUNITY): Payer: Self-pay | Admitting: Family Medicine

## 2024-02-22 DIAGNOSIS — R0609 Other forms of dyspnea: Secondary | ICD-10-CM

## 2024-02-26 ENCOUNTER — Ambulatory Visit (HOSPITAL_BASED_OUTPATIENT_CLINIC_OR_DEPARTMENT_OTHER)
Admission: RE | Admit: 2024-02-26 | Discharge: 2024-02-26 | Disposition: A | Discharge status: Home / Self Care | Payer: Self-pay | Source: Ambulatory Visit | Attending: Family Medicine | Admitting: Family Medicine

## 2024-02-26 DIAGNOSIS — E782 Mixed hyperlipidemia: Secondary | ICD-10-CM | POA: Insufficient documentation

## 2024-03-17 DIAGNOSIS — M329 Systemic lupus erythematosus, unspecified: Secondary | ICD-10-CM | POA: Diagnosis not present

## 2024-03-17 DIAGNOSIS — M79643 Pain in unspecified hand: Secondary | ICD-10-CM | POA: Diagnosis not present

## 2024-03-17 DIAGNOSIS — I7300 Raynaud's syndrome without gangrene: Secondary | ICD-10-CM | POA: Diagnosis not present

## 2024-03-17 DIAGNOSIS — M1990 Unspecified osteoarthritis, unspecified site: Secondary | ICD-10-CM | POA: Diagnosis not present

## 2024-03-28 ENCOUNTER — Ambulatory Visit (HOSPITAL_COMMUNITY)

## 2024-05-05 ENCOUNTER — Ambulatory Visit (HOSPITAL_COMMUNITY)
Admission: RE | Admit: 2024-05-05 | Discharge: 2024-05-05 | Disposition: A | Discharge status: Home / Self Care | Source: Ambulatory Visit | Attending: Cardiology | Admitting: Cardiology

## 2024-05-05 DIAGNOSIS — R0600 Dyspnea, unspecified: Secondary | ICD-10-CM | POA: Diagnosis not present

## 2024-05-05 DIAGNOSIS — R0609 Other forms of dyspnea: Secondary | ICD-10-CM | POA: Diagnosis not present

## 2024-05-06 LAB — ECHOCARDIOGRAM COMPLETE
Area-P 1/2: 2.66 cm2
S' Lateral: 2.2 cm

## 2024-05-19 ENCOUNTER — Other Ambulatory Visit: Payer: Self-pay | Admitting: Emergency Medicine

## 2024-05-19 DIAGNOSIS — Z1231 Encounter for screening mammogram for malignant neoplasm of breast: Secondary | ICD-10-CM

## 2024-05-26 ENCOUNTER — Other Ambulatory Visit: Payer: Self-pay | Admitting: Family Medicine

## 2024-05-26 DIAGNOSIS — G43909 Migraine, unspecified, not intractable, without status migrainosus: Secondary | ICD-10-CM

## 2024-06-13 ENCOUNTER — Other Ambulatory Visit: Payer: Self-pay | Admitting: Family Medicine

## 2024-06-13 DIAGNOSIS — N6310 Unspecified lump in the right breast, unspecified quadrant: Secondary | ICD-10-CM

## 2024-06-17 ENCOUNTER — Other Ambulatory Visit: Payer: Self-pay | Admitting: Family Medicine

## 2024-06-17 DIAGNOSIS — R928 Other abnormal and inconclusive findings on diagnostic imaging of breast: Secondary | ICD-10-CM

## 2024-06-20 ENCOUNTER — Other Ambulatory Visit: Payer: Self-pay | Admitting: Family Medicine

## 2024-06-20 ENCOUNTER — Ambulatory Visit
Admission: RE | Admit: 2024-06-20 | Discharge: 2024-06-20 | Disposition: A | Source: Ambulatory Visit | Attending: Family Medicine

## 2024-06-20 ENCOUNTER — Inpatient Hospital Stay: Admission: RE | Admit: 2024-06-20 | Discharge: 2024-06-20 | Attending: Family Medicine

## 2024-06-20 DIAGNOSIS — R928 Other abnormal and inconclusive findings on diagnostic imaging of breast: Secondary | ICD-10-CM

## 2024-06-22 ENCOUNTER — Ambulatory Visit
Admission: RE | Admit: 2024-06-22 | Discharge: 2024-06-22 | Disposition: A | Discharge status: Home / Self Care | Source: Ambulatory Visit | Attending: Family Medicine | Admitting: Family Medicine

## 2024-06-22 ENCOUNTER — Other Ambulatory Visit

## 2024-06-22 DIAGNOSIS — G43909 Migraine, unspecified, not intractable, without status migrainosus: Secondary | ICD-10-CM
